# Patient Record
Sex: Female | Born: 1950 | Hispanic: No | Smoking: Current every day smoker
Health system: Southern US, Community
[De-identification: ages and names within clinical notes are randomized; demographics above are authoritative.]

## PROBLEM LIST (undated history)

## (undated) ENCOUNTER — Encounter
Attending: Student in an Organized Health Care Education/Training Program | Primary: Student in an Organized Health Care Education/Training Program

## (undated) ENCOUNTER — Encounter

## (undated) ENCOUNTER — Encounter: Attending: Mental Health | Primary: Mental Health

## (undated) ENCOUNTER — Encounter: Attending: Neurology | Primary: Neurology

## (undated) ENCOUNTER — Ambulatory Visit: Payer: PRIVATE HEALTH INSURANCE

## (undated) ENCOUNTER — Telehealth

## (undated) ENCOUNTER — Ambulatory Visit: Payer: PRIVATE HEALTH INSURANCE | Attending: Neurology | Primary: Neurology

## (undated) ENCOUNTER — Inpatient Hospital Stay

## (undated) ENCOUNTER — Telehealth: Attending: Mental Health | Primary: Mental Health

## (undated) ENCOUNTER — Ambulatory Visit

## (undated) ENCOUNTER — Encounter
Attending: Pharmacist Clinician (PhC)/ Clinical Pharmacy Specialist | Primary: Pharmacist Clinician (PhC)/ Clinical Pharmacy Specialist

## (undated) ENCOUNTER — Ambulatory Visit: Payer: PRIVATE HEALTH INSURANCE | Attending: Speech-Language Pathologist | Primary: Speech-Language Pathologist

## (undated) ENCOUNTER — Telehealth: Attending: Neurology | Primary: Neurology

## (undated) ENCOUNTER — Ambulatory Visit: Attending: Physician Assistant | Primary: Physician Assistant

## (undated) ENCOUNTER — Ambulatory Visit: Attending: Pharmacist | Primary: Pharmacist

## (undated) ENCOUNTER — Telehealth: Attending: Emergency Medicine | Primary: Emergency Medicine

## (undated) ENCOUNTER — Ambulatory Visit
Payer: PRIVATE HEALTH INSURANCE | Attending: Rehabilitative and Restorative Service Providers" | Primary: Rehabilitative and Restorative Service Providers"

## (undated) ENCOUNTER — Ambulatory Visit: Payer: PRIVATE HEALTH INSURANCE | Attending: Spinal Cord Injury Medicine | Primary: Spinal Cord Injury Medicine

## (undated) ENCOUNTER — Telehealth
Attending: Pharmacist Clinician (PhC)/ Clinical Pharmacy Specialist | Primary: Pharmacist Clinician (PhC)/ Clinical Pharmacy Specialist

## (undated) ENCOUNTER — Ambulatory Visit: Payer: PRIVATE HEALTH INSURANCE | Attending: Urology | Primary: Urology

## (undated) DIAGNOSIS — G35 Multiple sclerosis: Secondary | ICD-10-CM

## (undated) HISTORY — PX: TUBAL LIGATION: SHX77

---

## 2019-11-10 DIAGNOSIS — Z9181 History of falling: Secondary | ICD-10-CM | POA: Diagnosis not present

## 2019-11-10 DIAGNOSIS — Z13220 Encounter for screening for lipoid disorders: Secondary | ICD-10-CM | POA: Diagnosis not present

## 2019-11-10 DIAGNOSIS — F329 Major depressive disorder, single episode, unspecified: Secondary | ICD-10-CM | POA: Diagnosis not present

## 2019-11-10 DIAGNOSIS — R202 Paresthesia of skin: Secondary | ICD-10-CM | POA: Diagnosis not present

## 2019-11-10 DIAGNOSIS — Z131 Encounter for screening for diabetes mellitus: Secondary | ICD-10-CM | POA: Diagnosis not present

## 2019-11-10 DIAGNOSIS — Z1331 Encounter for screening for depression: Secondary | ICD-10-CM | POA: Diagnosis not present

## 2019-11-10 DIAGNOSIS — R29898 Other symptoms and signs involving the musculoskeletal system: Secondary | ICD-10-CM | POA: Diagnosis not present

## 2019-11-10 DIAGNOSIS — R05 Cough: Secondary | ICD-10-CM | POA: Diagnosis not present

## 2019-11-10 DIAGNOSIS — R5383 Other fatigue: Secondary | ICD-10-CM | POA: Diagnosis not present

## 2019-11-10 DIAGNOSIS — R269 Unspecified abnormalities of gait and mobility: Secondary | ICD-10-CM | POA: Diagnosis not present

## 2019-11-10 DIAGNOSIS — R200 Anesthesia of skin: Secondary | ICD-10-CM | POA: Diagnosis not present

## 2019-11-10 DIAGNOSIS — Z1212 Encounter for screening for malignant neoplasm of rectum: Secondary | ICD-10-CM | POA: Diagnosis not present

## 2019-11-10 DIAGNOSIS — R0600 Dyspnea, unspecified: Secondary | ICD-10-CM | POA: Diagnosis not present

## 2019-11-10 DIAGNOSIS — R0602 Shortness of breath: Secondary | ICD-10-CM | POA: Diagnosis not present

## 2019-11-10 DIAGNOSIS — R2689 Other abnormalities of gait and mobility: Secondary | ICD-10-CM | POA: Diagnosis not present

## 2019-11-10 DIAGNOSIS — G629 Polyneuropathy, unspecified: Secondary | ICD-10-CM | POA: Diagnosis not present

## 2019-11-10 DIAGNOSIS — Z1211 Encounter for screening for malignant neoplasm of colon: Secondary | ICD-10-CM | POA: Diagnosis not present

## 2019-11-16 DIAGNOSIS — M546 Pain in thoracic spine: Secondary | ICD-10-CM | POA: Diagnosis not present

## 2019-11-16 DIAGNOSIS — R29898 Other symptoms and signs involving the musculoskeletal system: Secondary | ICD-10-CM | POA: Diagnosis not present

## 2019-11-16 DIAGNOSIS — M545 Low back pain: Secondary | ICD-10-CM | POA: Diagnosis not present

## 2019-11-23 DIAGNOSIS — R2689 Other abnormalities of gait and mobility: Secondary | ICD-10-CM | POA: Diagnosis not present

## 2019-11-23 DIAGNOSIS — R269 Unspecified abnormalities of gait and mobility: Secondary | ICD-10-CM | POA: Diagnosis not present

## 2019-11-23 DIAGNOSIS — Z6824 Body mass index (BMI) 24.0-24.9, adult: Secondary | ICD-10-CM | POA: Diagnosis not present

## 2019-11-23 DIAGNOSIS — R202 Paresthesia of skin: Secondary | ICD-10-CM | POA: Diagnosis not present

## 2019-11-23 DIAGNOSIS — R42 Dizziness and giddiness: Secondary | ICD-10-CM | POA: Diagnosis not present

## 2019-11-23 DIAGNOSIS — F329 Major depressive disorder, single episode, unspecified: Secondary | ICD-10-CM | POA: Diagnosis not present

## 2019-11-23 DIAGNOSIS — R200 Anesthesia of skin: Secondary | ICD-10-CM | POA: Diagnosis not present

## 2019-11-23 DIAGNOSIS — S22089A Unspecified fracture of T11-T12 vertebra, initial encounter for closed fracture: Secondary | ICD-10-CM | POA: Diagnosis not present

## 2019-11-23 DIAGNOSIS — G379 Demyelinating disease of central nervous system, unspecified: Secondary | ICD-10-CM | POA: Diagnosis not present

## 2019-11-24 DIAGNOSIS — M542 Cervicalgia: Secondary | ICD-10-CM | POA: Diagnosis not present

## 2019-11-24 DIAGNOSIS — M546 Pain in thoracic spine: Secondary | ICD-10-CM | POA: Diagnosis not present

## 2019-11-24 DIAGNOSIS — M545 Low back pain: Secondary | ICD-10-CM | POA: Diagnosis not present

## 2019-11-27 DIAGNOSIS — G379 Demyelinating disease of central nervous system, unspecified: Principal | ICD-10-CM

## 2019-11-30 DIAGNOSIS — R200 Anesthesia of skin: Secondary | ICD-10-CM | POA: Diagnosis not present

## 2019-11-30 DIAGNOSIS — M545 Low back pain: Secondary | ICD-10-CM | POA: Diagnosis not present

## 2019-11-30 DIAGNOSIS — M542 Cervicalgia: Secondary | ICD-10-CM | POA: Diagnosis not present

## 2019-11-30 DIAGNOSIS — M47812 Spondylosis without myelopathy or radiculopathy, cervical region: Secondary | ICD-10-CM | POA: Diagnosis not present

## 2019-11-30 DIAGNOSIS — M546 Pain in thoracic spine: Secondary | ICD-10-CM | POA: Diagnosis not present

## 2019-11-30 DIAGNOSIS — M50222 Other cervical disc displacement at C5-C6 level: Secondary | ICD-10-CM | POA: Diagnosis not present

## 2019-11-30 DIAGNOSIS — M5127 Other intervertebral disc displacement, lumbosacral region: Secondary | ICD-10-CM | POA: Diagnosis not present

## 2019-12-08 DIAGNOSIS — M546 Pain in thoracic spine: Secondary | ICD-10-CM | POA: Diagnosis not present

## 2019-12-08 DIAGNOSIS — M545 Low back pain: Secondary | ICD-10-CM | POA: Diagnosis not present

## 2019-12-08 DIAGNOSIS — M542 Cervicalgia: Secondary | ICD-10-CM | POA: Diagnosis not present

## 2019-12-11 ENCOUNTER — Ambulatory Visit: Admit: 2019-12-11 | Discharge: 2019-12-12 | Payer: PRIVATE HEALTH INSURANCE

## 2019-12-13 ENCOUNTER — Ambulatory Visit: Admit: 2019-12-13 | Discharge: 2019-12-14 | Payer: PRIVATE HEALTH INSURANCE | Attending: Neurology | Primary: Neurology

## 2019-12-13 DIAGNOSIS — Z7952 Long term (current) use of systemic steroids: Secondary | ICD-10-CM | POA: Diagnosis not present

## 2019-12-13 DIAGNOSIS — N3941 Urge incontinence: Secondary | ICD-10-CM | POA: Diagnosis not present

## 2019-12-13 DIAGNOSIS — G379 Demyelinating disease of central nervous system, unspecified: Secondary | ICD-10-CM | POA: Diagnosis not present

## 2019-12-13 DIAGNOSIS — M79604 Pain in right leg: Secondary | ICD-10-CM | POA: Diagnosis not present

## 2019-12-13 DIAGNOSIS — H539 Unspecified visual disturbance: Secondary | ICD-10-CM | POA: Diagnosis not present

## 2019-12-13 DIAGNOSIS — G959 Disease of spinal cord, unspecified: Secondary | ICD-10-CM | POA: Diagnosis not present

## 2019-12-13 DIAGNOSIS — Z114 Encounter for screening for human immunodeficiency virus [HIV]: Secondary | ICD-10-CM | POA: Diagnosis not present

## 2019-12-13 DIAGNOSIS — M79605 Pain in left leg: Secondary | ICD-10-CM | POA: Diagnosis not present

## 2019-12-13 DIAGNOSIS — Z1159 Encounter for screening for other viral diseases: Secondary | ICD-10-CM | POA: Diagnosis not present

## 2019-12-13 DIAGNOSIS — F17210 Nicotine dependence, cigarettes, uncomplicated: Secondary | ICD-10-CM | POA: Diagnosis not present

## 2019-12-13 DIAGNOSIS — R519 Headache, unspecified: Secondary | ICD-10-CM | POA: Diagnosis not present

## 2019-12-13 MED ORDER — FAMOTIDINE 20 MG TABLET
ORAL_TABLET | Freq: Two times a day (BID) | ORAL | PRN refills | 30.00000 days | Status: CP
Start: 2019-12-13 — End: ?

## 2019-12-13 MED ORDER — CALCIUM CARBONATE 600 MG CALCIUM (1,500 MG) TABLET
ORAL_TABLET | Freq: Every day | ORAL | PRN refills | 30.00000 days | Status: CP
Start: 2019-12-13 — End: ?

## 2019-12-20 DIAGNOSIS — I700 Atherosclerosis of aorta: Secondary | ICD-10-CM | POA: Diagnosis not present

## 2019-12-20 DIAGNOSIS — G379 Demyelinating disease of central nervous system, unspecified: Secondary | ICD-10-CM | POA: Diagnosis not present

## 2019-12-20 DIAGNOSIS — F329 Major depressive disorder, single episode, unspecified: Secondary | ICD-10-CM | POA: Diagnosis not present

## 2019-12-20 DIAGNOSIS — K5900 Constipation, unspecified: Secondary | ICD-10-CM | POA: Diagnosis not present

## 2019-12-20 DIAGNOSIS — S22089A Unspecified fracture of T11-T12 vertebra, initial encounter for closed fracture: Secondary | ICD-10-CM | POA: Diagnosis not present

## 2020-01-10 ENCOUNTER — Institutional Professional Consult (permissible substitution): Admit: 2020-01-10 | Payer: PRIVATE HEALTH INSURANCE | Attending: Neurology | Primary: Neurology

## 2020-01-10 DIAGNOSIS — G35 Multiple sclerosis: Secondary | ICD-10-CM | POA: Diagnosis not present

## 2020-01-10 DIAGNOSIS — E559 Vitamin D deficiency, unspecified: Secondary | ICD-10-CM | POA: Diagnosis not present

## 2020-01-10 MED ORDER — PREDNISONE 5 MG TABLET
ORAL_TABLET | 0 refills | 0 days | Status: CP
Start: 2020-01-10 — End: ?

## 2020-01-10 MED ORDER — ERGOCALCIFEROL (VITAMIN D2) 1,250 MCG (50,000 UNIT) CAPSULE
ORAL_CAPSULE | ORAL | 0 refills | 168 days | Status: CP
Start: 2020-01-10 — End: ?

## 2020-01-29 DIAGNOSIS — G35 Multiple sclerosis: Principal | ICD-10-CM

## 2020-01-29 MED ORDER — DIMETHYL FUMARATE 120 MG (14)-240 MG (46) CAPSULE,DELAYED RELEASE
ORAL_CAPSULE | 0 refills | 0 days | Status: CP
Start: 2020-01-29 — End: ?

## 2020-01-29 MED ORDER — DIMETHYL FUMARATE 240 MG CAPSULE,DELAYED RELEASE
ORAL_CAPSULE | Freq: Two times a day (BID) | ORAL | 1 refills | 30.00000 days | Status: CP
Start: 2020-01-29 — End: ?

## 2020-01-31 DIAGNOSIS — G35 Multiple sclerosis: Principal | ICD-10-CM

## 2020-02-05 DIAGNOSIS — G35 Multiple sclerosis: Secondary | ICD-10-CM | POA: Diagnosis not present

## 2020-02-05 DIAGNOSIS — S22089A Unspecified fracture of T11-T12 vertebra, initial encounter for closed fracture: Secondary | ICD-10-CM | POA: Diagnosis not present

## 2020-02-05 DIAGNOSIS — G4700 Insomnia, unspecified: Secondary | ICD-10-CM | POA: Diagnosis not present

## 2020-02-05 DIAGNOSIS — I700 Atherosclerosis of aorta: Secondary | ICD-10-CM | POA: Diagnosis not present

## 2020-02-05 DIAGNOSIS — K5900 Constipation, unspecified: Secondary | ICD-10-CM | POA: Diagnosis not present

## 2020-02-05 DIAGNOSIS — F329 Major depressive disorder, single episode, unspecified: Secondary | ICD-10-CM | POA: Diagnosis not present

## 2020-02-05 MED ORDER — TRAZODONE 50 MG TABLET
0 days
Start: 2020-02-05 — End: ?

## 2020-02-07 NOTE — Unmapped (Signed)
[facility] SSC Specialty Medication Onboarding    Specialty Medication: Dimethyl Fumarate starter pack  Prior Authorization: Approved   Financial Assistance: No - patient doesn't qualify for additional assistance   Final Copay/Day Supply: $90 / 30 days    Insurance Restrictions: Yes - max 1 month supply     Notes to Pharmacist:     The triage team has completed the benefits investigation and has determined that the patient is able to fill this medication at [facility]. Please contact the patient to complete the onboarding or follow up with the prescribing physician as needed.      [facility] SSC Specialty Medication Onboarding    Specialty Medication: Dimethyl Fumarate 240mg  capsules  Prior Authorization: Approved   Financial Assistance: No - patient doesn't qualify for additional assistance   Final Copay/Day Supply: $191.35 / 30 days    Insurance Restrictions: Yes - max 1 month supply     Notes to Pharmacist:     The triage team has completed the benefits investigation and has determined that the patient is able to fill this medication at [facility]. Please contact the patient to complete the onboarding or follow up with the prescribing physician as needed.

## 2020-02-08 NOTE — Unmapped (Unsigned)
This patient has been disenrolled from the [facility] specialty pharmacy services due to enrollment in a manufacturer assistance program that sends medicine directly to the patient.    Leslie Goodman  [facility] Specialty Pharmacist appetite, fatigue)      Contraindications, Warnings & Precautions     ??? Hepatotoxicity ??? LFTs prior to treatment and during treatment as clinically necessary  ??? 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 Leukopenia ??? CBC including lymphocyte count prior to initiation and every 3 months thereafter as clinically necessary    Drug/Food Interactions     ??? Medication list reviewed in Epic. The patient was instructed to inform the care team before taking any new medications or supplements. No drug interactions identified.   ??? Consider avoiding live vaccines during treatment (not explicitly contraindicated)     Storage, Handling Precautions, & Disposal     ??? Store at room temperature in the original, labelled container.      Current Medications (including OTC/herbals), Comorbidities and Allergies     Current Outpatient Medications   Medication Sig Dispense Refill   ??? calcium carbonate (CALCIUM 600) 1,500 mg (600 mg elem calcium) tablet Take 1 tablet (600 mg of elem calcium total) by mouth daily. 30 tablet PRN   ??? cyanocobalamin, vitamin B-12, (VITAMIN B-12) 1000 MCG tablet Take 1,000 mcg by mouth daily.     ??? dimethyl fumarate 120 mg (14)- 240 mg (46) CpDR Take 1 (120 mg) capsule by mouth twice daily for 7 days. THEN take 1 (240 mg) twice daily thereafter. Take with food 60 capsule 0   ??? dimethyl fumarate 240 mg CpDR Take 1 capsule (240 mg total) by mouth two (2) times a day. 60 capsule 1   ??? DULoxetine (CYMBALTA) 60 MG capsule      ??? ergocalciferol-1,250 mcg, 50,000 unit, (DRISDOL) 1,250 mcg (50,000 unit) capsule Take 1 capsule (1,250 mcg total) by mouth once a week. 24 capsule 0   ??? famotidine (PEPCID) 20 MG tablet Take 1 tablet (20 mg total) by mouth Two (2) times a day. As Goodman as on steroids 60 tablet PRN   ??? polyethylene glycol (GLYCOLAX) 17 gram/dose powder      ??? predniSONE (DELTASONE) 5 MG tablet Take Prednisone 10 mg/day for 2 weeks, then 5 mg/day for 2 weeks, then stop 21 tablet 0     No current facility-administered medications for this visit.       No Known Allergies    There is no problem list on file for this patient.      Reviewed and up to date in Epic.    Appropriateness of Therapy     Is medication and dose appropriate based on diagnosis? Yes    Prescription has been clinically reviewed: Yes    Baseline Quality of Life Assessment      How many days over the past month did your MS  keep you from your normal activities? For example, brushing your teeth or getting up in the morning. {Blank:19197::0,***,Patient declined to answer}    Financial Information     Medication Assistance provided: Prior Authorization    Anticipated copay of $90 (for starter) & $191.35 (for maintenance) reviewed with patient. Verified delivery address.    Delivery Information     Scheduled delivery date: ***    Expected start date: ***    Medication will be delivered via {Blank:19197::UPS,Next Day Courier,Same Day Courier,Clinic Courier - ***  clinic,***} to the {Blank:19197::prescription,temporary} address in Epic WAM.  This shipment {Blank single:19197::will,will not} require a signature.      Explained the services we provide at [facility] and that each month we would call to set up refills.  Stressed importance of returning phone calls so that we could ensure they receive their medications in time each month.  Informed patient that we should be setting up refills 7-10 days prior to when they will run out of medication.  A pharmacist will reach out to perform a clinical assessment periodically.  Informed patient that a welcome packet and a drug information handout will be sent.      Patient verbalized understanding of the above information as well as how to contact the pharmacy at [phone] option [phone] with any questions/concerns.  The pharmacy is open Monday through Friday 8:30am-4:30pm.  A pharmacist is available 24/7 via pager to answer any clinical questions they may have.    Patient Specific Needs     - Does the patient have any physical, cognitive, or cultural barriers? {Blank single:19197::No,Yes - ***}    - Patient prefers to have medications discussed with  {Blank single:19197::Patient,Family Member,Caregiver,Other}     - Is the patient or caregiver able to read and understand education materials at a high school level or above? {Blank single:19197::No,Yes}    - Patient's primary language is  {Blank single:19197::English,Spanish,***}     - Is the patient high risk? {sschighriskpts:78327}    - Does the patient require a Care Management Plan? {Blank single:19197::No,Yes}     - Does the patient require physician intervention or other additional services (i.e. nutrition, smoking cessation, social work)? {Blank single:19197::No,Yes - ***}      Leslie Goodman  [facility] Specialty Pharmacist

## 2020-02-09 DIAGNOSIS — G35 Multiple sclerosis: Principal | ICD-10-CM

## 2020-02-09 MED ORDER — VUMERITY 231 MG CAPSULE,DELAYED RELEASE
ORAL_CAPSULE | 2 refills | 0 days | Status: CP
Start: 2020-02-09 — End: ?

## 2020-02-09 NOTE — Unmapped (Signed)
Neurology Clinical Pharmacist Telephone Call    Medication: Vumerity    Called patient to discuss alternative DMT due to cost of dimethyl fumarate. Discussed Vumerity, dosing, side effects, administration (taking without food, or low calorie/low protein snack), efficacy compared to dimethyl fumarate and copay assistance. Patient would like to try and see cost for Vumerity. Will send rx to pharmacy and start form via mychart and regular mail per patient request.    Worthy Flank, PharmD, CPP  Clinical Pharmacist, [facility]  Phone: [phone]

## 2020-02-12 DIAGNOSIS — G35 Multiple sclerosis: Principal | ICD-10-CM

## 2020-02-12 NOTE — Unmapped (Signed)
Thanks

## 2020-04-26 DIAGNOSIS — R2689 Other abnormalities of gait and mobility: Secondary | ICD-10-CM | POA: Diagnosis not present

## 2020-04-26 DIAGNOSIS — I700 Atherosclerosis of aorta: Secondary | ICD-10-CM | POA: Diagnosis not present

## 2020-04-26 DIAGNOSIS — G35 Multiple sclerosis: Secondary | ICD-10-CM | POA: Diagnosis not present

## 2020-04-26 DIAGNOSIS — F339 Major depressive disorder, recurrent, unspecified: Secondary | ICD-10-CM | POA: Diagnosis not present

## 2020-04-26 DIAGNOSIS — R531 Weakness: Secondary | ICD-10-CM | POA: Diagnosis not present

## 2020-04-26 DIAGNOSIS — D519 Vitamin B12 deficiency anemia, unspecified: Secondary | ICD-10-CM | POA: Diagnosis not present

## 2020-04-26 DIAGNOSIS — E785 Hyperlipidemia, unspecified: Secondary | ICD-10-CM | POA: Diagnosis not present

## 2020-04-26 DIAGNOSIS — Z23 Encounter for immunization: Secondary | ICD-10-CM | POA: Diagnosis not present

## 2020-04-26 DIAGNOSIS — S22089A Unspecified fracture of T11-T12 vertebra, initial encounter for closed fracture: Secondary | ICD-10-CM | POA: Diagnosis not present

## 2020-05-01 DIAGNOSIS — G4701 Insomnia due to medical condition: Secondary | ICD-10-CM | POA: Diagnosis not present

## 2020-05-01 DIAGNOSIS — I700 Atherosclerosis of aorta: Secondary | ICD-10-CM | POA: Diagnosis not present

## 2020-05-01 DIAGNOSIS — G35 Multiple sclerosis: Secondary | ICD-10-CM | POA: Diagnosis not present

## 2020-05-01 DIAGNOSIS — D519 Vitamin B12 deficiency anemia, unspecified: Secondary | ICD-10-CM | POA: Diagnosis not present

## 2020-05-01 DIAGNOSIS — S22089D Unspecified fracture of T11-T12 vertebra, subsequent encounter for fracture with routine healing: Secondary | ICD-10-CM | POA: Diagnosis not present

## 2020-05-01 DIAGNOSIS — F17200 Nicotine dependence, unspecified, uncomplicated: Secondary | ICD-10-CM | POA: Diagnosis not present

## 2020-05-01 DIAGNOSIS — M2550 Pain in unspecified joint: Secondary | ICD-10-CM | POA: Diagnosis not present

## 2020-05-01 DIAGNOSIS — Z9181 History of falling: Secondary | ICD-10-CM | POA: Diagnosis not present

## 2020-05-01 DIAGNOSIS — E785 Hyperlipidemia, unspecified: Secondary | ICD-10-CM | POA: Diagnosis not present

## 2020-05-01 DIAGNOSIS — Z79899 Other long term (current) drug therapy: Secondary | ICD-10-CM | POA: Diagnosis not present

## 2020-05-01 DIAGNOSIS — F339 Major depressive disorder, recurrent, unspecified: Secondary | ICD-10-CM | POA: Diagnosis not present

## 2020-05-02 DIAGNOSIS — Z1331 Encounter for screening for depression: Secondary | ICD-10-CM | POA: Diagnosis not present

## 2020-05-02 DIAGNOSIS — Z0000 Encounter for general adult medical examination without abnormal findings: Secondary | ICD-10-CM | POA: Diagnosis not present

## 2020-05-02 DIAGNOSIS — Z9181 History of falling: Secondary | ICD-10-CM | POA: Diagnosis not present

## 2020-05-02 DIAGNOSIS — E785 Hyperlipidemia, unspecified: Secondary | ICD-10-CM | POA: Diagnosis not present

## 2020-05-02 DIAGNOSIS — Z139 Encounter for screening, unspecified: Secondary | ICD-10-CM | POA: Diagnosis not present

## 2020-05-02 MED ORDER — ROSUVASTATIN 5 MG TABLET
0 days
Start: 2020-05-02 — End: ?

## 2020-05-15 ENCOUNTER — Ambulatory Visit
Admit: 2020-05-15 | Discharge: 2020-05-16 | Payer: PRIVATE HEALTH INSURANCE | Attending: Spinal Cord Injury Medicine | Primary: Spinal Cord Injury Medicine

## 2020-05-15 ENCOUNTER — Ambulatory Visit: Admit: 2020-05-15 | Discharge: 2020-05-16 | Payer: PRIVATE HEALTH INSURANCE | Attending: Neurology | Primary: Neurology

## 2020-05-15 DIAGNOSIS — N319 Neuromuscular dysfunction of bladder, unspecified: Secondary | ICD-10-CM | POA: Diagnosis not present

## 2020-05-15 DIAGNOSIS — F419 Anxiety disorder, unspecified: Secondary | ICD-10-CM | POA: Diagnosis not present

## 2020-05-15 DIAGNOSIS — G35 Multiple sclerosis: Secondary | ICD-10-CM | POA: Diagnosis not present

## 2020-05-15 MED ORDER — AMANTADINE HCL 100 MG TABLET
ORAL_TABLET | Freq: Every morning | ORAL | 1 refills | 90 days | Status: CP
Start: 2020-05-15 — End: ?
  Filled 2020-05-21: qty 90, 90d supply, fill #0

## 2020-05-20 DIAGNOSIS — Z9181 History of falling: Secondary | ICD-10-CM | POA: Diagnosis not present

## 2020-05-20 DIAGNOSIS — S22089D Unspecified fracture of T11-T12 vertebra, subsequent encounter for fracture with routine healing: Secondary | ICD-10-CM | POA: Diagnosis not present

## 2020-05-20 DIAGNOSIS — G35 Multiple sclerosis: Secondary | ICD-10-CM | POA: Diagnosis not present

## 2020-05-20 DIAGNOSIS — R54 Age-related physical debility: Secondary | ICD-10-CM | POA: Diagnosis not present

## 2020-05-20 DIAGNOSIS — G629 Polyneuropathy, unspecified: Secondary | ICD-10-CM | POA: Diagnosis not present

## 2020-05-20 DIAGNOSIS — G379 Demyelinating disease of central nervous system, unspecified: Secondary | ICD-10-CM | POA: Diagnosis not present

## 2020-05-21 MED FILL — AMANTADINE HCL 100 MG TABLET: 90 days supply | Qty: 90 | Fill #0 | Status: AC

## 2020-05-25 MED ORDER — VUMERITY 231 MG CAPSULE,DELAYED RELEASE
ORAL_CAPSULE | Freq: Two times a day (BID) | ORAL | 5 refills | 0 days | Status: CP
Start: 2020-05-25 — End: ?

## 2020-05-25 MED ORDER — DIAZEPAM 5 MG TABLET
ORAL_TABLET | Freq: Four times a day (QID) | ORAL | 0 refills | 1 days | Status: CP | PRN
Start: 2020-05-25 — End: ?
  Filled 2020-06-03: qty 1, 1d supply, fill #0

## 2020-05-31 DIAGNOSIS — I700 Atherosclerosis of aorta: Secondary | ICD-10-CM | POA: Diagnosis not present

## 2020-05-31 DIAGNOSIS — S22089D Unspecified fracture of T11-T12 vertebra, subsequent encounter for fracture with routine healing: Secondary | ICD-10-CM | POA: Diagnosis not present

## 2020-05-31 DIAGNOSIS — G35 Multiple sclerosis: Secondary | ICD-10-CM | POA: Diagnosis not present

## 2020-05-31 DIAGNOSIS — D519 Vitamin B12 deficiency anemia, unspecified: Secondary | ICD-10-CM | POA: Diagnosis not present

## 2020-05-31 DIAGNOSIS — G4701 Insomnia due to medical condition: Secondary | ICD-10-CM | POA: Diagnosis not present

## 2020-05-31 DIAGNOSIS — F339 Major depressive disorder, recurrent, unspecified: Secondary | ICD-10-CM | POA: Diagnosis not present

## 2020-05-31 DIAGNOSIS — E785 Hyperlipidemia, unspecified: Secondary | ICD-10-CM | POA: Diagnosis not present

## 2020-05-31 DIAGNOSIS — F17200 Nicotine dependence, unspecified, uncomplicated: Secondary | ICD-10-CM | POA: Diagnosis not present

## 2020-05-31 DIAGNOSIS — M2550 Pain in unspecified joint: Secondary | ICD-10-CM | POA: Diagnosis not present

## 2020-05-31 DIAGNOSIS — Z9181 History of falling: Secondary | ICD-10-CM | POA: Diagnosis not present

## 2020-05-31 DIAGNOSIS — Z79899 Other long term (current) drug therapy: Secondary | ICD-10-CM | POA: Diagnosis not present

## 2020-06-03 MED FILL — DIAZEPAM 5 MG TABLET: 1 days supply | Qty: 1 | Fill #0 | Status: AC

## 2020-08-28 MED FILL — AMANTADINE HCL 100 MG TABLET: ORAL | 90 days supply | Qty: 90 | Fill #1

## 2020-08-31 ENCOUNTER — Ambulatory Visit: Admit: 2020-08-31 | Discharge: 2020-08-31 | Payer: PRIVATE HEALTH INSURANCE

## 2020-08-31 DIAGNOSIS — R9082 White matter disease, unspecified: Secondary | ICD-10-CM | POA: Diagnosis not present

## 2020-08-31 DIAGNOSIS — G319 Degenerative disease of nervous system, unspecified: Secondary | ICD-10-CM | POA: Diagnosis not present

## 2020-08-31 DIAGNOSIS — G35 Multiple sclerosis: Secondary | ICD-10-CM | POA: Diagnosis not present

## 2020-08-31 DIAGNOSIS — G959 Disease of spinal cord, unspecified: Secondary | ICD-10-CM | POA: Diagnosis not present

## 2020-09-02 MED ORDER — GABAPENTIN 100 MG CAPSULE
0 days
Start: 2020-09-02 — End: ?

## 2020-09-04 ENCOUNTER — Institutional Professional Consult (permissible substitution): Admit: 2020-09-04 | Payer: PRIVATE HEALTH INSURANCE | Attending: Neurology | Primary: Neurology

## 2020-09-04 DIAGNOSIS — N319 Neuromuscular dysfunction of bladder, unspecified: Secondary | ICD-10-CM | POA: Diagnosis not present

## 2020-09-04 DIAGNOSIS — G35 Multiple sclerosis: Secondary | ICD-10-CM | POA: Diagnosis not present

## 2020-09-04 MED ORDER — CHOLECALCIFEROL (VITAMIN D3) 100 MCG (4,000 UNIT) CAPSULE
ORAL_CAPSULE | Freq: Every day | ORAL | 11 refills | 0 days
Start: 2020-09-04 — End: ?

## 2020-09-22 MED ORDER — AMANTADINE HCL 100 MG TABLET
ORAL_TABLET | Freq: Every morning | ORAL | 5 refills | 90 days | Status: CP
Start: 2020-09-22 — End: ?

## 2020-09-22 MED ORDER — VUMERITY 231 MG CAPSULE,DELAYED RELEASE
ORAL_CAPSULE | Freq: Two times a day (BID) | ORAL | 1 refills | 0 days | Status: CP
Start: 2020-09-22 — End: ?

## 2020-11-06 ENCOUNTER — Ambulatory Visit: Admit: 2020-11-06 | Discharge: 2020-11-07 | Payer: PRIVATE HEALTH INSURANCE | Attending: Neurology | Primary: Neurology

## 2020-11-06 DIAGNOSIS — N319 Neuromuscular dysfunction of bladder, unspecified: Secondary | ICD-10-CM | POA: Diagnosis not present

## 2020-11-06 DIAGNOSIS — G35 Multiple sclerosis: Secondary | ICD-10-CM | POA: Diagnosis not present

## 2020-11-06 DIAGNOSIS — R5383 Other fatigue: Secondary | ICD-10-CM | POA: Diagnosis not present

## 2020-11-06 DIAGNOSIS — Z5181 Encounter for therapeutic drug level monitoring: Secondary | ICD-10-CM | POA: Diagnosis not present

## 2020-11-06 MED ORDER — AMANTADINE HCL 100 MG TABLET
ORAL_TABLET | Freq: Every morning | ORAL | 5 refills | 60 days | Status: CP
Start: 2020-11-06 — End: ?

## 2020-11-07 MED ORDER — CHOLECALCIFEROL (VITAMIN D3) 100 MCG (4,000 UNIT) CAPSULE
ORAL_CAPSULE | Freq: Every day | ORAL | 11 refills | 0 days
Start: 2020-11-07 — End: ?

## 2020-11-08 DIAGNOSIS — R5383 Other fatigue: Principal | ICD-10-CM

## 2020-11-08 DIAGNOSIS — G35 Multiple sclerosis: Principal | ICD-10-CM

## 2020-11-08 MED ORDER — AMANTADINE HCL 100 MG TABLET
ORAL_TABLET | Freq: Every morning | ORAL | 5 refills | 30 days | Status: CP
Start: 2020-11-08 — End: ?
  Filled 2020-11-11: qty 60, 30d supply, fill #0

## 2020-11-12 MED ORDER — CHOLECALCIFEROL (VITAMIN D3) 100 MCG (4,000 UNIT) CAPSULE
ORAL_CAPSULE | Freq: Every day | ORAL | 11 refills | 0.00000 days
Start: 2020-11-12 — End: ?

## 2020-11-17 MED ORDER — VUMERITY 231 MG CAPSULE,DELAYED RELEASE
ORAL_CAPSULE | Freq: Two times a day (BID) | ORAL | 5 refills | 0.00000 days | Status: CP
Start: 2020-11-17 — End: 2020-11-17

## 2020-12-26 MED FILL — AMANTADINE HCL 100 MG TABLET: ORAL | 30 days supply | Qty: 60 | Fill #1

## 2021-01-24 MED FILL — AMANTADINE HCL 100 MG TABLET: ORAL | 30 days supply | Qty: 60 | Fill #2

## 2021-02-09 ENCOUNTER — Other Ambulatory Visit: Payer: Self-pay

## 2021-02-09 ENCOUNTER — Emergency Department (HOSPITAL_COMMUNITY): Payer: PPO

## 2021-02-09 ENCOUNTER — Inpatient Hospital Stay (HOSPITAL_COMMUNITY)
Admission: EM | Admit: 2021-02-09 | Discharge: 2021-02-17 | DRG: 481 | Disposition: A | Discharge status: Skilled Nursing Facility | Payer: PPO | Attending: Family Medicine | Admitting: Family Medicine

## 2021-02-09 ENCOUNTER — Encounter (HOSPITAL_COMMUNITY): Payer: Self-pay | Admitting: Emergency Medicine

## 2021-02-09 DIAGNOSIS — M549 Dorsalgia, unspecified: Secondary | ICD-10-CM | POA: Diagnosis present

## 2021-02-09 DIAGNOSIS — Z8781 Personal history of (healed) traumatic fracture: Secondary | ICD-10-CM

## 2021-02-09 DIAGNOSIS — M545 Low back pain, unspecified: Secondary | ICD-10-CM | POA: Diagnosis not present

## 2021-02-09 DIAGNOSIS — S72001A Fracture of unspecified part of neck of right femur, initial encounter for closed fracture: Secondary | ICD-10-CM | POA: Diagnosis present

## 2021-02-09 DIAGNOSIS — D72829 Elevated white blood cell count, unspecified: Secondary | ICD-10-CM | POA: Diagnosis present

## 2021-02-09 DIAGNOSIS — K5900 Constipation, unspecified: Secondary | ICD-10-CM | POA: Diagnosis not present

## 2021-02-09 DIAGNOSIS — S72001D Fracture of unspecified part of neck of right femur, subsequent encounter for closed fracture with routine healing: Secondary | ICD-10-CM | POA: Diagnosis not present

## 2021-02-09 DIAGNOSIS — W010XXA Fall on same level from slipping, tripping and stumbling without subsequent striking against object, initial encounter: Secondary | ICD-10-CM | POA: Diagnosis present

## 2021-02-09 DIAGNOSIS — M25551 Pain in right hip: Secondary | ICD-10-CM | POA: Diagnosis not present

## 2021-02-09 DIAGNOSIS — R0902 Hypoxemia: Secondary | ICD-10-CM | POA: Diagnosis not present

## 2021-02-09 DIAGNOSIS — S32000A Wedge compression fracture of unspecified lumbar vertebra, initial encounter for closed fracture: Secondary | ICD-10-CM | POA: Diagnosis not present

## 2021-02-09 DIAGNOSIS — E785 Hyperlipidemia, unspecified: Secondary | ICD-10-CM | POA: Diagnosis present

## 2021-02-09 DIAGNOSIS — Z043 Encounter for examination and observation following other accident: Secondary | ICD-10-CM | POA: Diagnosis not present

## 2021-02-09 DIAGNOSIS — M79604 Pain in right leg: Secondary | ICD-10-CM | POA: Diagnosis not present

## 2021-02-09 DIAGNOSIS — Z981 Arthrodesis status: Secondary | ICD-10-CM | POA: Diagnosis not present

## 2021-02-09 DIAGNOSIS — S72011A Unspecified intracapsular fracture of right femur, initial encounter for closed fracture: Principal | ICD-10-CM | POA: Diagnosis present

## 2021-02-09 DIAGNOSIS — F17210 Nicotine dependence, cigarettes, uncomplicated: Secondary | ICD-10-CM | POA: Diagnosis present

## 2021-02-09 DIAGNOSIS — W1809XA Striking against other object with subsequent fall, initial encounter: Secondary | ICD-10-CM | POA: Diagnosis not present

## 2021-02-09 DIAGNOSIS — Z8249 Family history of ischemic heart disease and other diseases of the circulatory system: Secondary | ICD-10-CM | POA: Diagnosis not present

## 2021-02-09 DIAGNOSIS — S32019A Unspecified fracture of first lumbar vertebra, initial encounter for closed fracture: Secondary | ICD-10-CM | POA: Diagnosis present

## 2021-02-09 DIAGNOSIS — Z419 Encounter for procedure for purposes other than remedying health state, unspecified: Secondary | ICD-10-CM

## 2021-02-09 DIAGNOSIS — Z20822 Contact with and (suspected) exposure to covid-19: Secondary | ICD-10-CM | POA: Diagnosis present

## 2021-02-09 DIAGNOSIS — G35 Multiple sclerosis: Secondary | ICD-10-CM | POA: Diagnosis present

## 2021-02-09 DIAGNOSIS — M47812 Spondylosis without myelopathy or radiculopathy, cervical region: Secondary | ICD-10-CM | POA: Diagnosis not present

## 2021-02-09 DIAGNOSIS — Z833 Family history of diabetes mellitus: Secondary | ICD-10-CM | POA: Diagnosis not present

## 2021-02-09 DIAGNOSIS — Z9889 Other specified postprocedural states: Secondary | ICD-10-CM | POA: Diagnosis not present

## 2021-02-09 DIAGNOSIS — W19XXXA Unspecified fall, initial encounter: Secondary | ICD-10-CM | POA: Diagnosis not present

## 2021-02-09 DIAGNOSIS — S32010A Wedge compression fracture of first lumbar vertebra, initial encounter for closed fracture: Secondary | ICD-10-CM | POA: Diagnosis not present

## 2021-02-09 DIAGNOSIS — T148XXA Other injury of unspecified body region, initial encounter: Secondary | ICD-10-CM

## 2021-02-09 DIAGNOSIS — Z01810 Encounter for preprocedural cardiovascular examination: Secondary | ICD-10-CM | POA: Diagnosis not present

## 2021-02-09 DIAGNOSIS — R531 Weakness: Secondary | ICD-10-CM | POA: Diagnosis not present

## 2021-02-09 DIAGNOSIS — Z7401 Bed confinement status: Secondary | ICD-10-CM | POA: Diagnosis not present

## 2021-02-09 DIAGNOSIS — S72009A Fracture of unspecified part of neck of unspecified femur, initial encounter for closed fracture: Secondary | ICD-10-CM | POA: Diagnosis present

## 2021-02-09 DIAGNOSIS — M4856XA Collapsed vertebra, not elsewhere classified, lumbar region, initial encounter for fracture: Secondary | ICD-10-CM | POA: Diagnosis present

## 2021-02-09 DIAGNOSIS — S72051A Unspecified fracture of head of right femur, initial encounter for closed fracture: Secondary | ICD-10-CM | POA: Diagnosis not present

## 2021-02-09 DIAGNOSIS — I6782 Cerebral ischemia: Secondary | ICD-10-CM | POA: Diagnosis not present

## 2021-02-09 DIAGNOSIS — M419 Scoliosis, unspecified: Secondary | ICD-10-CM | POA: Diagnosis not present

## 2021-02-09 DIAGNOSIS — R9431 Abnormal electrocardiogram [ECG] [EKG]: Secondary | ICD-10-CM | POA: Diagnosis not present

## 2021-02-09 DIAGNOSIS — T07XXXA Unspecified multiple injuries, initial encounter: Secondary | ICD-10-CM | POA: Diagnosis not present

## 2021-02-09 HISTORY — DX: Multiple sclerosis: G35

## 2021-02-09 LAB — CBC WITH DIFFERENTIAL/PLATELET
Abs Immature Granulocytes: 0.1 10*3/uL — ABNORMAL HIGH (ref 0.00–0.07)
Basophils Absolute: 0.1 10*3/uL (ref 0.0–0.1)
Basophils Relative: 0 %
Eosinophils Absolute: 0 10*3/uL (ref 0.0–0.5)
Eosinophils Relative: 0 %
HCT: 41.9 % (ref 36.0–46.0)
Hemoglobin: 14.2 g/dL (ref 12.0–15.0)
Immature Granulocytes: 1 %
Lymphocytes Relative: 7 %
Lymphs Abs: 1.1 10*3/uL (ref 0.7–4.0)
MCH: 34 pg (ref 26.0–34.0)
MCHC: 33.9 g/dL (ref 30.0–36.0)
MCV: 100.2 fL — ABNORMAL HIGH (ref 80.0–100.0)
Monocytes Absolute: 1.3 10*3/uL — ABNORMAL HIGH (ref 0.1–1.0)
Monocytes Relative: 8 %
Neutro Abs: 13.3 10*3/uL — ABNORMAL HIGH (ref 1.7–7.7)
Neutrophils Relative %: 84 %
Platelets: 199 10*3/uL (ref 150–400)
RBC: 4.18 MIL/uL (ref 3.87–5.11)
RDW: 13.3 % (ref 11.5–15.5)
WBC: 15.9 10*3/uL — ABNORMAL HIGH (ref 4.0–10.5)
nRBC: 0 % (ref 0.0–0.2)

## 2021-02-09 LAB — BASIC METABOLIC PANEL
Anion gap: 13 (ref 5–15)
BUN: 11 mg/dL (ref 8–23)
CO2: 22 mmol/L (ref 22–32)
Calcium: 9.1 mg/dL (ref 8.9–10.3)
Chloride: 103 mmol/L (ref 98–111)
Creatinine, Ser: 0.56 mg/dL (ref 0.44–1.00)
GFR, Estimated: >60 mL/min (ref >60)
Glucose, Bld: 110 mg/dL — ABNORMAL HIGH (ref 70–99)
Potassium: 3.6 mmol/L (ref 3.5–5.1)
Sodium: 138 mmol/L (ref 135–145)

## 2021-02-09 MED ORDER — FENTANYL CITRATE PF 50 MCG/ML IJ SOSY
50.0000 ug | PREFILLED_SYRINGE | Freq: Once | INTRAMUSCULAR | Status: AC
  Administered 2021-02-10: 50 ug via INTRAVENOUS
  Filled 2021-02-09: qty 1

## 2021-02-09 NOTE — ED Provider Notes (Signed)
Emergency Medicine Provider Triage Evaluation Note  Carrie Martinez , a 70 y.o. female  was evaluated in triage.  Pt complains of mechanical fall trying to get to smoke detector. Took Norco at [facility]. No thinners. Pain to back and right hip, femur  Hx of MS. Typically has issues with right leg however more pain currently  Review of Systems  Positive: Fall, back pain, hip, femur pain Negative: Syncope, CP, SOB, numbness  Physical Exam  There were no vitals taken for this visit. Gen:   Awake, no distress   Resp:  Normal effort  Cardiac: 2+ DP pulses bilaterally MSK:   Difficulty exam in chair. Laying on left side with legs flexed at knees. Diffuse tenderness to thoracic, lumbar area and right hip, femur. Cannot lay on back to assess for shortening or rotation of legs. Other:    Medical Decision Making  Medically screening exam initiated at 8:30 PM.  Appropriate orders placed.  Liane Comber Lenhoff was informed that the remainder of the evaluation will be completed by another provider, this initial triage assessment does not replace that evaluation, and the importance of remaining in the ED until their evaluation is complete.  Fall, back pain, right hip pain   Sole Lengacher A, PA-C 02/09/21 2115    Terald Sleeper, MD 02/10/21 [phone]

## 2021-02-09 NOTE — ED Triage Notes (Signed)
Per EMS, tp from home by self.  Pt had a fall from a standing, c/o right back, hip and leg pain.  She is unable to move it.  Reports she took hydrocodone around 5:30pm but it did not relieve the pain.    142/80 94% 118HR

## 2021-02-10 ENCOUNTER — Inpatient Hospital Stay (HOSPITAL_COMMUNITY): Payer: PPO | Admitting: Anesthesiology

## 2021-02-10 ENCOUNTER — Encounter (HOSPITAL_COMMUNITY)
Admission: EM | Disposition: A | Discharge status: Skilled Nursing Facility | Payer: Self-pay | Source: Home / Self Care | Attending: Family Medicine

## 2021-02-10 ENCOUNTER — Inpatient Hospital Stay (HOSPITAL_COMMUNITY): Payer: PPO

## 2021-02-10 ENCOUNTER — Emergency Department (HOSPITAL_COMMUNITY): Payer: PPO

## 2021-02-10 ENCOUNTER — Encounter (HOSPITAL_COMMUNITY): Payer: Self-pay | Admitting: Internal Medicine

## 2021-02-10 DIAGNOSIS — Z9889 Other specified postprocedural states: Secondary | ICD-10-CM | POA: Diagnosis not present

## 2021-02-10 DIAGNOSIS — R9431 Abnormal electrocardiogram [ECG] [EKG]: Secondary | ICD-10-CM

## 2021-02-10 DIAGNOSIS — M25551 Pain in right hip: Secondary | ICD-10-CM | POA: Diagnosis not present

## 2021-02-10 DIAGNOSIS — S72001A Fracture of unspecified part of neck of right femur, initial encounter for closed fracture: Secondary | ICD-10-CM | POA: Diagnosis not present

## 2021-02-10 DIAGNOSIS — Z833 Family history of diabetes mellitus: Secondary | ICD-10-CM | POA: Diagnosis not present

## 2021-02-10 DIAGNOSIS — Z8249 Family history of ischemic heart disease and other diseases of the circulatory system: Secondary | ICD-10-CM | POA: Diagnosis not present

## 2021-02-10 DIAGNOSIS — S72011A Unspecified intracapsular fracture of right femur, initial encounter for closed fracture: Secondary | ICD-10-CM | POA: Diagnosis present

## 2021-02-10 DIAGNOSIS — S32010A Wedge compression fracture of first lumbar vertebra, initial encounter for closed fracture: Secondary | ICD-10-CM

## 2021-02-10 DIAGNOSIS — W010XXA Fall on same level from slipping, tripping and stumbling without subsequent striking against object, initial encounter: Secondary | ICD-10-CM | POA: Diagnosis present

## 2021-02-10 DIAGNOSIS — Z01810 Encounter for preprocedural cardiovascular examination: Secondary | ICD-10-CM

## 2021-02-10 DIAGNOSIS — Z20822 Contact with and (suspected) exposure to covid-19: Secondary | ICD-10-CM | POA: Diagnosis present

## 2021-02-10 DIAGNOSIS — M549 Dorsalgia, unspecified: Secondary | ICD-10-CM | POA: Diagnosis present

## 2021-02-10 DIAGNOSIS — S32019A Unspecified fracture of first lumbar vertebra, initial encounter for closed fracture: Secondary | ICD-10-CM | POA: Diagnosis present

## 2021-02-10 DIAGNOSIS — E785 Hyperlipidemia, unspecified: Secondary | ICD-10-CM | POA: Diagnosis present

## 2021-02-10 DIAGNOSIS — M545 Low back pain, unspecified: Secondary | ICD-10-CM | POA: Diagnosis not present

## 2021-02-10 DIAGNOSIS — G35 Multiple sclerosis: Secondary | ICD-10-CM | POA: Diagnosis present

## 2021-02-10 DIAGNOSIS — S72009A Fracture of unspecified part of neck of unspecified femur, initial encounter for closed fracture: Secondary | ICD-10-CM | POA: Diagnosis present

## 2021-02-10 DIAGNOSIS — F17210 Nicotine dependence, cigarettes, uncomplicated: Secondary | ICD-10-CM | POA: Diagnosis present

## 2021-02-10 DIAGNOSIS — D72829 Elevated white blood cell count, unspecified: Secondary | ICD-10-CM | POA: Diagnosis present

## 2021-02-10 DIAGNOSIS — M4856XA Collapsed vertebra, not elsewhere classified, lumbar region, initial encounter for fracture: Secondary | ICD-10-CM | POA: Diagnosis present

## 2021-02-10 HISTORY — PX: PERCUTANEOUS PINNING: SHX2209

## 2021-02-10 LAB — CBC
HCT: 39.8 % (ref 36.0–46.0)
Hemoglobin: 13.5 g/dL (ref 12.0–15.0)
MCH: 33.8 pg (ref 26.0–34.0)
MCHC: 33.9 g/dL (ref 30.0–36.0)
MCV: 99.7 fL (ref 80.0–100.0)
Platelets: 182 10*3/uL (ref 150–400)
RBC: 3.99 MIL/uL (ref 3.87–5.11)
RDW: 13 % (ref 11.5–15.5)
WBC: 9.9 10*3/uL (ref 4.0–10.5)
nRBC: 0 % (ref 0.0–0.2)

## 2021-02-10 LAB — BASIC METABOLIC PANEL
Anion gap: 8 (ref 5–15)
BUN: 8 mg/dL (ref 8–23)
CO2: 25 mmol/L (ref 22–32)
Calcium: 8.9 mg/dL (ref 8.9–10.3)
Chloride: 104 mmol/L (ref 98–111)
Creatinine, Ser: 0.48 mg/dL (ref 0.44–1.00)
GFR, Estimated: >60 mL/min (ref >60)
Glucose, Bld: 120 mg/dL — ABNORMAL HIGH (ref 70–99)
Potassium: 3.5 mmol/L (ref 3.5–5.1)
Sodium: 137 mmol/L (ref 135–145)

## 2021-02-10 LAB — RESP PANEL BY RT-PCR (FLU A&B, COVID) ARPGX2
Influenza A by PCR: NEGATIVE
Influenza B by PCR: NEGATIVE
SARS Coronavirus 2 by RT PCR: NEGATIVE

## 2021-02-10 LAB — SURGICAL PCR SCREEN
MRSA, PCR: NEGATIVE
Staphylococcus aureus: NEGATIVE

## 2021-02-10 LAB — HIV ANTIBODY (ROUTINE TESTING W REFLEX): HIV Screen 4th Generation wRfx: NONREACTIVE

## 2021-02-10 SURGERY — PINNING, EXTREMITY, PERCUTANEOUS
Anesthesia: General | Site: Hip | Laterality: Right

## 2021-02-10 MED ORDER — ACETAMINOPHEN 325 MG PO TABS: 325.0000 mg | ORAL_TABLET | Freq: Four times a day (QID) | ORAL | Status: DC | PRN

## 2021-02-10 MED ORDER — SODIUM CHLORIDE 0.9 % IV SOLN: INTRAVENOUS | Status: DC

## 2021-02-10 MED ORDER — DIPHENHYDRAMINE HCL 12.5 MG/5ML PO ELIX: 12.5000 mg | ORAL_SOLUTION | ORAL | Status: DC | PRN

## 2021-02-10 MED ORDER — MORPHINE SULFATE (PF) 4 MG/ML IV SOLN
4.0000 mg | Freq: Once | INTRAVENOUS | Status: AC
  Administered 2021-02-10: 4 mg via INTRAVENOUS
  Filled 2021-02-10: qty 1

## 2021-02-10 MED ORDER — CHLORHEXIDINE GLUCONATE 0.12 % MT SOLN
OROMUCOSAL | Status: AC
  Administered 2021-02-10: 15 mL
  Filled 2021-02-10: qty 15

## 2021-02-10 MED ORDER — CEFAZOLIN SODIUM-DEXTROSE 2-4 GM/100ML-% IV SOLN
2.0000 g | INTRAVENOUS | Status: AC
  Administered 2021-02-10: 2 g via INTRAVENOUS
  Filled 2021-02-10: qty 100

## 2021-02-10 MED ORDER — MORPHINE SULFATE (PF) 2 MG/ML IV SOLN
0.5000 mg | INTRAVENOUS | Status: DC | PRN
Start: 2021-02-10 — End: 2021-02-12
  Administered 2021-02-12: 0.5 mg via INTRAVENOUS
  Filled 2021-02-10: qty 1

## 2021-02-10 MED ORDER — ONDANSETRON HCL 4 MG PO TABS: 4.0000 mg | ORAL_TABLET | Freq: Four times a day (QID) | ORAL | Status: DC | PRN

## 2021-02-10 MED ORDER — POLYETHYLENE GLYCOL 3350 17 G PO PACK: 17.0000 g | PACK | Freq: Every day | ORAL | Status: DC | PRN

## 2021-02-10 MED ORDER — VITAMIN B-12 100 MCG PO TABS
100.0000 ug | ORAL_TABLET | Freq: Every day | ORAL | Status: DC
  Administered 2021-02-11 – 2021-02-17 (×6): 100 ug via ORAL
  Filled 2021-02-10 (×9): qty 1

## 2021-02-10 MED ORDER — PROPOFOL 10 MG/ML IV BOLUS
INTRAVENOUS | Status: DC | PRN
  Administered 2021-02-10: 100 mg via INTRAVENOUS

## 2021-02-10 MED ORDER — HYDROCODONE-ACETAMINOPHEN 5-325 MG PO TABS
1.0000 | ORAL_TABLET | ORAL | Status: DC | PRN
  Administered 2021-02-10: 1 via ORAL
  Administered 2021-02-11 – 2021-02-17 (×9): 2 via ORAL
  Filled 2021-02-10 (×3): qty 2
  Filled 2021-02-10: qty 1
  Filled 2021-02-10 (×6): qty 2

## 2021-02-10 MED ORDER — METOCLOPRAMIDE HCL 5 MG/ML IJ SOLN: 5.0000 mg | Freq: Three times a day (TID) | INTRAMUSCULAR | Status: DC | PRN

## 2021-02-10 MED ORDER — ONDANSETRON HCL 4 MG/2ML IJ SOLN: 4.0000 mg | Freq: Once | INTRAMUSCULAR | Status: AC

## 2021-02-10 MED ORDER — ASPIRIN 81 MG PO CHEW
81.0000 mg | CHEWABLE_TABLET | Freq: Two times a day (BID) | ORAL | Status: DC
  Administered 2021-02-11 (×2): 81 mg via ORAL
  Filled 2021-02-10 (×3): qty 1

## 2021-02-10 MED ORDER — GABAPENTIN 100 MG PO CAPS: 200.0000 mg | ORAL_CAPSULE | Freq: Every evening | ORAL | Status: DC | PRN

## 2021-02-10 MED ORDER — ONDANSETRON HCL 4 MG/2ML IJ SOLN
4.0000 mg | Freq: Once | INTRAMUSCULAR | Status: AC
  Administered 2021-02-10: 4 mg via INTRAVENOUS
  Filled 2021-02-10: qty 2

## 2021-02-10 MED ORDER — PROPOFOL 10 MG/ML IV BOLUS
INTRAVENOUS | Status: AC
  Filled 2021-02-10: qty 20

## 2021-02-10 MED ORDER — DEXAMETHASONE SODIUM PHOSPHATE 10 MG/ML IJ SOLN
INTRAMUSCULAR | Status: DC | PRN
  Administered 2021-02-10: 5 mg via INTRAVENOUS

## 2021-02-10 MED ORDER — ALBUMIN HUMAN 5 % IV SOLN
INTRAVENOUS | Status: AC
  Filled 2021-02-10: qty 250

## 2021-02-10 MED ORDER — BISACODYL 10 MG RE SUPP
10.0000 mg | Freq: Every day | RECTAL | Status: DC | PRN
  Administered 2021-02-15: 10 mg via RECTAL
  Filled 2021-02-10: qty 1

## 2021-02-10 MED ORDER — FENTANYL CITRATE (PF) 250 MCG/5ML IJ SOLN
INTRAMUSCULAR | Status: AC
  Filled 2021-02-10: qty 5

## 2021-02-10 MED ORDER — CEFAZOLIN SODIUM-DEXTROSE 2-4 GM/100ML-% IV SOLN
2.0000 g | Freq: Four times a day (QID) | INTRAVENOUS | Status: AC
  Administered 2021-02-11 (×2): 2 g via INTRAVENOUS
  Filled 2021-02-10 (×2): qty 100

## 2021-02-10 MED ORDER — AMISULPRIDE (ANTIEMETIC) 5 MG/2ML IV SOLN: 10.0000 mg | Freq: Once | INTRAVENOUS | Status: DC | PRN

## 2021-02-10 MED ORDER — LACTATED RINGERS IV SOLN: INTRAVENOUS | Status: DC

## 2021-02-10 MED ORDER — DULOXETINE HCL 60 MG PO CPEP
60.0000 mg | ORAL_CAPSULE | Freq: Every day | ORAL | Status: DC
  Administered 2021-02-11 – 2021-02-17 (×6): 60 mg via ORAL
  Filled 2021-02-10 (×7): qty 1

## 2021-02-10 MED ORDER — HYDROCODONE-ACETAMINOPHEN 7.5-325 MG PO TABS
1.0000 | ORAL_TABLET | ORAL | Status: DC | PRN
  Administered 2021-02-12: 1 via ORAL
  Administered 2021-02-12 – 2021-02-15 (×3): 2 via ORAL
  Administered 2021-02-15: 1 via ORAL
  Administered 2021-02-16 (×2): 2 via ORAL
  Filled 2021-02-10: qty 1
  Filled 2021-02-10: qty 2
  Filled 2021-02-10: qty 1
  Filled 2021-02-10 (×4): qty 2

## 2021-02-10 MED ORDER — MORPHINE SULFATE (PF) 2 MG/ML IV SOLN
1.0000 mg | INTRAVENOUS | Status: DC | PRN
  Administered 2021-02-10: 2 mg via INTRAVENOUS
  Filled 2021-02-10: qty 1

## 2021-02-10 MED ORDER — FENTANYL CITRATE (PF) 250 MCG/5ML IJ SOLN
INTRAMUSCULAR | Status: DC | PRN
  Administered 2021-02-10: 100 ug via INTRAVENOUS
  Administered 2021-02-10 (×2): 25 ug via INTRAVENOUS

## 2021-02-10 MED ORDER — METOCLOPRAMIDE HCL 5 MG PO TABS: 5.0000 mg | ORAL_TABLET | Freq: Three times a day (TID) | ORAL | Status: DC | PRN

## 2021-02-10 MED ORDER — CEFAZOLIN SODIUM-DEXTROSE 2-4 GM/100ML-% IV SOLN
INTRAVENOUS | Status: AC
  Filled 2021-02-10: qty 100

## 2021-02-10 MED ORDER — ONDANSETRON HCL 4 MG/2ML IJ SOLN
INTRAMUSCULAR | Status: DC | PRN
  Administered 2021-02-10: 4 mg via INTRAVENOUS

## 2021-02-10 MED ORDER — CALCIUM CARBONATE-VITAMIN D 500-200 MG-UNIT PO TABS
1.0000 | ORAL_TABLET | Freq: Every day | ORAL | Status: DC
  Administered 2021-02-11 – 2021-02-17 (×6): 1 via ORAL
  Filled 2021-02-10 (×7): qty 1

## 2021-02-10 MED ORDER — ROSUVASTATIN CALCIUM 5 MG PO TABS
5.0000 mg | ORAL_TABLET | Freq: Every day | ORAL | Status: DC
  Administered 2021-02-11 – 2021-02-17 (×6): 5 mg via ORAL
  Filled 2021-02-10 (×6): qty 1

## 2021-02-10 MED ORDER — PHENYLEPHRINE 40 MCG/ML (10ML) SYRINGE FOR IV PUSH (FOR BLOOD PRESSURE SUPPORT)
PREFILLED_SYRINGE | INTRAVENOUS | Status: DC | PRN
  Administered 2021-02-10: 120 ug via INTRAVENOUS
  Administered 2021-02-10 (×2): 80 ug via INTRAVENOUS
  Administered 2021-02-10: 120 ug via INTRAVENOUS

## 2021-02-10 MED ORDER — MORPHINE SULFATE (PF) 2 MG/ML IV SOLN
0.5000 mg | INTRAVENOUS | Status: DC | PRN
  Administered 2021-02-10 (×2): 1 mg via INTRAVENOUS
  Filled 2021-02-10 (×2): qty 1

## 2021-02-10 MED ORDER — PHENOL 1.4 % MT LIQD: 1.0000 | OROMUCOSAL | Status: DC | PRN

## 2021-02-10 MED ORDER — AMANTADINE HCL 100 MG PO CAPS
200.0000 mg | ORAL_CAPSULE | Freq: Every day | ORAL | Status: DC
  Administered 2021-02-11 – 2021-02-17 (×6): 200 mg via ORAL
  Filled 2021-02-10 (×9): qty 2

## 2021-02-10 MED ORDER — PHENYLEPHRINE HCL-NACL 20-0.9 MG/250ML-% IV SOLN
INTRAVENOUS | Status: DC | PRN
  Administered 2021-02-10: 25 ug/min via INTRAVENOUS

## 2021-02-10 MED ORDER — METHOCARBAMOL 500 MG PO TABS
ORAL_TABLET | ORAL | Status: AC
  Filled 2021-02-10: qty 1

## 2021-02-10 MED ORDER — METHOCARBAMOL 500 MG PO TABS
500.0000 mg | ORAL_TABLET | Freq: Four times a day (QID) | ORAL | Status: DC | PRN
  Administered 2021-02-10 – 2021-02-17 (×6): 500 mg via ORAL
  Filled 2021-02-10 (×6): qty 1

## 2021-02-10 MED ORDER — MENTHOL 3 MG MT LOZG: 1.0000 | LOZENGE | OROMUCOSAL | Status: DC | PRN

## 2021-02-10 MED ORDER — TRANEXAMIC ACID-NACL 1000-0.7 MG/100ML-% IV SOLN
1000.0000 mg | Freq: Once | INTRAVENOUS | Status: AC
  Administered 2021-02-10: 1000 mg via INTRAVENOUS
  Filled 2021-02-10: qty 100

## 2021-02-10 MED ORDER — SUCCINYLCHOLINE CHLORIDE 200 MG/10ML IV SOSY
PREFILLED_SYRINGE | INTRAVENOUS | Status: DC | PRN
  Administered 2021-02-10: 120 mg via INTRAVENOUS

## 2021-02-10 MED ORDER — FERROUS SULFATE 325 (65 FE) MG PO TABS
325.0000 mg | ORAL_TABLET | Freq: Three times a day (TID) | ORAL | Status: DC
  Administered 2021-02-11 – 2021-02-17 (×19): 325 mg via ORAL
  Filled 2021-02-10 (×19): qty 1

## 2021-02-10 MED ORDER — FENTANYL CITRATE (PF) 100 MCG/2ML IJ SOLN
INTRAMUSCULAR | Status: AC
  Filled 2021-02-10: qty 2

## 2021-02-10 MED ORDER — LIDOCAINE 2% (20 MG/ML) 5 ML SYRINGE
INTRAMUSCULAR | Status: DC | PRN
  Administered 2021-02-10: 60 mg via INTRAVENOUS

## 2021-02-10 MED ORDER — DEXAMETHASONE SODIUM PHOSPHATE 10 MG/ML IJ SOLN
10.0000 mg | Freq: Once | INTRAMUSCULAR | Status: AC
  Administered 2021-02-11: 10 mg via INTRAVENOUS
  Filled 2021-02-10: qty 1

## 2021-02-10 MED ORDER — MORPHINE SULFATE (PF) 2 MG/ML IV SOLN
0.5000 mg | INTRAVENOUS | Status: DC | PRN
  Administered 2021-02-10 (×2): 0.5 mg via INTRAVENOUS
  Filled 2021-02-10 (×2): qty 1

## 2021-02-10 MED ORDER — DIROXIMEL FUMARATE 231 MG PO CPDR: 462.0000 mg | DELAYED_RELEASE_CAPSULE | Freq: Two times a day (BID) | ORAL | Status: DC

## 2021-02-10 MED ORDER — ROCURONIUM BROMIDE 10 MG/ML (PF) SYRINGE
PREFILLED_SYRINGE | INTRAVENOUS | Status: DC | PRN
  Administered 2021-02-10: 20 mg via INTRAVENOUS

## 2021-02-10 MED ORDER — EPHEDRINE SULFATE-NACL 50-0.9 MG/10ML-% IV SOSY
PREFILLED_SYRINGE | INTRAVENOUS | Status: DC | PRN
  Administered 2021-02-10: 5 mg via INTRAVENOUS

## 2021-02-10 MED ORDER — ALBUMIN HUMAN 5 % IV SOLN
12.5000 g | Freq: Once | INTRAVENOUS | Status: AC
  Administered 2021-02-10: 12.5 g via INTRAVENOUS

## 2021-02-10 MED ORDER — METHOCARBAMOL 1000 MG/10ML IJ SOLN
500.0000 mg | Freq: Four times a day (QID) | INTRAVENOUS | Status: DC | PRN
  Filled 2021-02-10 (×2): qty 5

## 2021-02-10 MED ORDER — POVIDONE-IODINE 10 % EX SWAB
2.0000 "application " | Freq: Once | CUTANEOUS | Status: AC
  Administered 2021-02-10: 2 via TOPICAL

## 2021-02-10 MED ORDER — FENTANYL CITRATE (PF) 100 MCG/2ML IJ SOLN
25.0000 ug | INTRAMUSCULAR | Status: DC | PRN
  Administered 2021-02-10: 25 ug via INTRAVENOUS

## 2021-02-10 MED ORDER — CHLORHEXIDINE GLUCONATE 4 % EX LIQD: 60.0000 mL | Freq: Once | CUTANEOUS | Status: DC

## 2021-02-10 MED ORDER — ONDANSETRON HCL 4 MG/2ML IJ SOLN
INTRAMUSCULAR | Status: AC
  Administered 2021-02-10: 4 mg via INTRAVENOUS
  Filled 2021-02-10: qty 2

## 2021-02-10 MED ORDER — METHOCARBAMOL 1000 MG/10ML IJ SOLN
500.0000 mg | Freq: Three times a day (TID) | INTRAVENOUS | Status: DC | PRN
  Administered 2021-02-10: 500 mg via INTRAVENOUS
  Filled 2021-02-10: qty 500
  Filled 2021-02-10: qty 5

## 2021-02-10 MED ORDER — 0.9 % SODIUM CHLORIDE (POUR BTL) OPTIME
TOPICAL | Status: DC | PRN
  Administered 2021-02-10: 1000 mL

## 2021-02-10 MED ORDER — SENNA 8.6 MG PO TABS
1.0000 | ORAL_TABLET | Freq: Every day | ORAL | Status: DC
  Administered 2021-02-10: 8.6 mg via ORAL
  Filled 2021-02-10 (×2): qty 1

## 2021-02-10 MED ORDER — ONDANSETRON HCL 4 MG/2ML IJ SOLN: 4.0000 mg | Freq: Four times a day (QID) | INTRAMUSCULAR | Status: DC | PRN

## 2021-02-10 MED ORDER — TRAZODONE HCL 50 MG PO TABS
50.0000 mg | ORAL_TABLET | Freq: Every evening | ORAL | Status: DC | PRN
  Filled 2021-02-10: qty 1

## 2021-02-10 MED ORDER — POLYETHYLENE GLYCOL 3350 17 G PO PACK: 17.0000 g | PACK | Freq: Two times a day (BID) | ORAL | Status: DC

## 2021-02-10 MED ORDER — SUGAMMADEX SODIUM 200 MG/2ML IV SOLN
INTRAVENOUS | Status: DC | PRN
  Administered 2021-02-10: 200 mg via INTRAVENOUS

## 2021-02-10 MED ORDER — DOCUSATE SODIUM 100 MG PO CAPS
100.0000 mg | ORAL_CAPSULE | Freq: Two times a day (BID) | ORAL | Status: DC
  Administered 2021-02-10 – 2021-02-15 (×8): 100 mg via ORAL
  Filled 2021-02-10 (×12): qty 1

## 2021-02-10 SURGICAL SUPPLY — 59 items
ADH SKN CLS APL DERMABOND .7 (GAUZE/BANDAGES/DRESSINGS) ×1
APL SKNCLS STERI-STRIP NONHPOA (GAUZE/BANDAGES/DRESSINGS)
BAG COUNTER SPONGE SURGICOUNT (BAG) ×2 IMPLANT
BAG SPNG CNTER NS LX DISP (BAG) ×1
BENZOIN TINCTURE PRP APPL 2/3 (GAUZE/BANDAGES/DRESSINGS) IMPLANT
BIT DRILL 4.8X200 CANN (BIT) ×1 IMPLANT
BLADE CLIPPER SURG (BLADE) IMPLANT
BNDG ELASTIC 3X5.8 VLCR STR LF (GAUZE/BANDAGES/DRESSINGS) ×3 IMPLANT
BNDG ELASTIC 4X5.8 VLCR STR LF (GAUZE/BANDAGES/DRESSINGS) IMPLANT
BNDG GAUZE ELAST 4 BULKY (GAUZE/BANDAGES/DRESSINGS) ×3 IMPLANT
COVER SURGICAL LIGHT HANDLE (MISCELLANEOUS) ×2 IMPLANT
CUFF TOURN SGL QUICK 18X4 (TOURNIQUET CUFF) IMPLANT
CUFF TOURN SGL QUICK 24 (TOURNIQUET CUFF)
CUFF TRNQT CYL 24X4X16.5-23 (TOURNIQUET CUFF) IMPLANT
DERMABOND ADVANCED (GAUZE/BANDAGES/DRESSINGS) ×1
DERMABOND ADVANCED .7 DNX12 (GAUZE/BANDAGES/DRESSINGS) IMPLANT
DRSG AQUACEL AG 3.5X4 (GAUZE/BANDAGES/DRESSINGS) ×1 IMPLANT
DRSG AQUACEL AG ADV 3.5X 6 (GAUZE/BANDAGES/DRESSINGS) ×1 IMPLANT
DRSG EMULSION OIL 3X3 NADH (GAUZE/BANDAGES/DRESSINGS) IMPLANT
DURAPREP 26ML APPLICATOR (WOUND CARE) ×2 IMPLANT
GAUZE SPONGE 4X4 12PLY STRL (GAUZE/BANDAGES/DRESSINGS) IMPLANT
GAUZE XEROFORM 1X8 LF (GAUZE/BANDAGES/DRESSINGS) IMPLANT
GLOVE BIO SURGEON STRL SZ7 (GLOVE) ×1 IMPLANT
GLOVE OPTIFIT SS 6.0 STRL BRWN (GLOVE) ×1 IMPLANT
GLOVE SURG ENC MOIS LTX SZ6.5 (GLOVE) ×1 IMPLANT
GLOVE SURG ORTHO LTX SZ7.5 (GLOVE) ×2 IMPLANT
GLOVE SURG ORTHO LTX SZ8 (GLOVE) ×2 IMPLANT
GLOVE SURG UNDER POLY LF SZ7.5 (GLOVE) ×2 IMPLANT
GOWN STRL REUS W/ TWL LRG LVL3 (GOWN DISPOSABLE) ×2 IMPLANT
GOWN STRL REUS W/TWL LRG LVL3 (GOWN DISPOSABLE) ×4
GUIDE PIN DRILL TIP 2.8X450HIP (PIN) ×6
KIT BASIN OR (CUSTOM PROCEDURE TRAY) ×2 IMPLANT
KIT TURNOVER KIT B (KITS) ×2 IMPLANT
MANIFOLD NEPTUNE II (INSTRUMENTS) ×2 IMPLANT
NS IRRIG 1000ML POUR BTL (IV SOLUTION) ×1 IMPLANT
PACK ORTHO EXTREMITY (CUSTOM PROCEDURE TRAY) ×2 IMPLANT
PAD ARMBOARD 7.5X6 YLW CONV (MISCELLANEOUS) ×4 IMPLANT
PIN GUIDE DRILL TIP 2.8X450HIP (PIN) IMPLANT
SCREW 8.0X80MMX16 (Screw) ×1 IMPLANT
SCREW CANN 8.0X70 16 THRD (Screw) IMPLANT
SCREW CANNULATED 8.0X70MM (Screw) ×2 IMPLANT
SCREW CANNULATED 8.0X75MM (Screw) ×2 IMPLANT
SCREW CANNULATED 8.8X80MM HIP (Screw) ×1 IMPLANT
SCREW CANNULATED HIP 8.0X75MM (Screw) ×1 IMPLANT
STRIP CLOSURE SKIN 1/2X4 (GAUZE/BANDAGES/DRESSINGS) IMPLANT
SUT ETHILON 4 0 P 3 18 (SUTURE) IMPLANT
SUT ETHILON 5 0 P 3 18 (SUTURE)
SUT MNCRL AB 4-0 PS2 18 (SUTURE) ×1 IMPLANT
SUT NYLON ETHILON 5-0 P-3 1X18 (SUTURE) IMPLANT
SUT PROLENE 4 0 P 3 18 (SUTURE) IMPLANT
SUT VIC AB 0 CT1 27 (SUTURE) ×2
SUT VIC AB 0 CT1 27XBRD ANBCTR (SUTURE) IMPLANT
SUT VIC AB 1 CT1 27 (SUTURE) ×2
SUT VIC AB 1 CT1 27XBRD ANBCTR (SUTURE) IMPLANT
SUT VIC AB 2-0 CT1 27 (SUTURE) ×2
SUT VIC AB 2-0 CT1 TAPERPNT 27 (SUTURE) IMPLANT
TOWEL GREEN STERILE (TOWEL DISPOSABLE) ×2 IMPLANT
TOWEL GREEN STERILE FF (TOWEL DISPOSABLE) ×2 IMPLANT
WATER STERILE IRR 1000ML POUR (IV SOLUTION) ×1 IMPLANT

## 2021-02-10 NOTE — Progress Notes (Addendum)
PROGRESS NOTE    Carrie Martinez  HBZ:[id number] DOB: 05/26/1951 DOA: 02/09/2021 PCP: Marlyn Corporal, PA   Brief Narrative: 70 year old with past medical history significant for MS, she uses a walker to ambulate, had a mechanical fall and was found to have right hip fracture and L1 compression fracture.  Cardiology has been consulted for preop clearance due to abnormal EKG.  Plan is for surgery today by Dr. Charlann Boxer.     Assessment & Plan:   Active Problems:   Closed right hip fracture, initial encounter (HCC)   Closed compression fracture of body of L1 vertebra (HCC)   Multiple sclerosis (HCC)   Hip fracture (HCC)  1-Right hip fracture: -In the setting of mechanical fall. -Cardiology consulted for preop clearance. -Plan for surgery this afternoon by Dr. Charlann Boxer.  -Continue with IV morphine as needed for pain control. -Place foley catheter for retention.   2-L1 compression fracture: Dr Charlann Boxer will ask Dr Shon Baton to review L 1 compression fracture.   3-Leukocytosis: Reactive. Resolved.   4-MS: Plan to resume amantadine, Diroximel.  Continue with Cymbalta.   5-HLD; Resume Crestor.      Estimated body mass index is 19.97 kg/m as calculated from the following:   Height as of this encounter: 5\' 5"  (1.651 m).   Weight as of this encounter: 54.4 kg.   DVT prophylaxis: SCD, will need to start DVT prophylaxis post Sx Code Status: Full Code Family Communication: Care discussed with Son who was at bedside.  Disposition Plan:  Status is: Inpatient  Remains inpatient appropriate because:IV treatments appropriate due to intensity of illness or inability to take PO  Dispo: The patient is from: Home              Anticipated d/c is to: SNF              Patient currently is not medically stable to d/c.   Difficult to place patient No        Consultants:  Cardiology Othropedic, Dr .   Procedures:  None  Antimicrobials:    Subjective: She is alert, complaining of  right hip pain. She denies chest pain or dyspnea. She report some SOB on exertion.   Objective: Vitals:   02/10/21 0450 02/10/21 0645 02/10/21 0745 02/10/21 0830  BP:  (!) 110/52 (!) 102/51 (!) 109/45  Pulse:  73 77 81  Resp:  (!) 21 (!) 26 (!) 26  Temp: 98.6 F (37 C)     TempSrc: Oral     SpO2:  91% 92% 93%  Weight:      Height:       No intake or output data in the 24 hours ending 02/10/21 0950 Filed Weights   02/09/21 2114  Weight: 54.4 kg    Examination:  General exam: Appears calm and comfortable  Respiratory system: Clear to auscultation. Respiratory effort normal. Cardiovascular system: S1 & S2 heard, RRR.  Gastrointestinal system: Abdomen is nondistended, soft and nontender. No organomegaly or masses felt. Normal bowel sounds heard. Central nervous system: Alert and oriented. Extremities: asymmetric LE   Data Reviewed: I have personally reviewed following labs and imaging studies  CBC: Recent Labs  Lab 02/09/21 2123 02/10/21 0457  WBC 15.9* 9.9  NEUTROABS 13.3*  --   HGB 14.2 13.5  HCT 41.9 39.8  MCV 100.2* 99.7  PLT 199 182   Basic Metabolic Panel: Recent Labs  Lab 02/09/21 2123 02/10/21 0457  NA 138 137  K 3.6 3.5  CL 103  104  CO2 22 25  GLUCOSE 110* 120*  BUN 11 8  CREATININE 0.56 0.48  CALCIUM 9.1 8.9   GFR: Estimated Creatinine Clearance: 57 mL/min (by C-G formula based on SCr of 0.48 mg/dL). Liver Function Tests: No results for input(s): AST, ALT, ALKPHOS, BILITOT, PROT, ALBUMIN in the last 168 hours. No results for input(s): LIPASE, AMYLASE in the last 168 hours. No results for input(s): AMMONIA in the last 168 hours. Coagulation Profile: No results for input(s): INR, PROTIME in the last 168 hours. Cardiac Enzymes: No results for input(s): CKTOTAL, CKMB, CKMBINDEX, TROPONINI in the last 168 hours. BNP (last 3 results) No results for input(s): PROBNP in the last 8760 hours. HbA1C: No results for input(s): HGBA1C in the last 72  hours. CBG: No results for input(s): GLUCAP in the last 168 hours. Lipid Profile: No results for input(s): CHOL, HDL, LDLCALC, TRIG, CHOLHDL, LDLDIRECT in the last 72 hours. Thyroid Function Tests: No results for input(s): TSH, T4TOTAL, FREET4, T3FREE, THYROIDAB in the last 72 hours. Anemia Panel: No results for input(s): VITAMINB12, FOLATE, FERRITIN, TIBC, IRON, RETICCTPCT in the last 72 hours. Sepsis Labs: No results for input(s): PROCALCITON, LATICACIDVEN in the last 168 hours.  Recent Results (from the past 240 hour(s))  Resp Panel by RT-PCR (Flu A&B, Covid) Nasopharyngeal Swab     Status: None   Collection Time: 02/10/21 12:03 AM   Specimen: Nasopharyngeal Swab; Nasopharyngeal(NP) swabs in vial transport medium  Result Value Ref Range Status   SARS Coronavirus 2 by RT PCR NEGATIVE NEGATIVE Final    Comment: (NOTE) SARS-CoV-2 target nucleic acids are NOT DETECTED.  The SARS-CoV-2 RNA is generally detectable in upper respiratory specimens during the acute phase of infection. The lowest concentration of SARS-CoV-2 viral copies this assay can detect is 138 copies/mL. A negative result does not preclude SARS-Cov-2 infection and should not be used as the sole basis for treatment or other patient management decisions. A negative result may occur with  improper specimen collection/handling, submission of specimen other than nasopharyngeal swab, presence of viral mutation(s) within the areas targeted by this assay, and inadequate number of viral copies(<138 copies/mL). A negative result must be combined with clinical observations, patient history, and epidemiological information. The expected result is Negative.  Fact Sheet for Patients:  BloggerCourse.comhttps://www.fda.gov/media/152166/download  Fact Sheet for Healthcare Providers:  SeriousBroker.ithttps://www.fda.gov/media/152162/download  This test is no t yet approved or cleared by the [place] FDA and  has been authorized for detection and/or  diagnosis of SARS-CoV-2 by FDA under an Emergency Use Authorization (EUA). This EUA will remain  in effect (meaning this test can be used) for the duration of the COVID-19 declaration under Section 564(b)(1) of the Act, 21 U.S.C.section 360bbb-3(b)(1), unless the authorization is terminated  or revoked sooner.       Influenza A by PCR NEGATIVE NEGATIVE Final   Influenza B by PCR NEGATIVE NEGATIVE Final    Comment: (NOTE) The Xpert Xpress SARS-CoV-2/FLU/RSV plus assay is intended as an aid in the diagnosis of influenza from Nasopharyngeal swab specimens and should not be used as a sole basis for treatment. Nasal washings and aspirates are unacceptable for Xpert Xpress SARS-CoV-2/FLU/RSV testing.  Fact Sheet for Patients: BloggerCourse.comhttps://www.fda.gov/media/152166/download  Fact Sheet for Healthcare Providers: SeriousBroker.ithttps://www.fda.gov/media/152162/download  This test is not yet approved or cleared by the [place] FDA and has been authorized for detection and/or diagnosis of SARS-CoV-2 by FDA under an Emergency Use Authorization (EUA). This EUA will remain in effect (meaning this test can be used)  for the duration of the COVID-19 declaration under Section 564(b)(1) of the Act, 21 U.S.C. section 360bbb-3(b)(1), unless the authorization is terminated or revoked.  Performed at [facility], [address], [place], [place] [address]          Radiology Studies: DG Thoracic Spine 2 View  Result Date: 02/09/2021 CLINICAL DATA:  Status post fall. EXAM: THORACIC SPINE 2 VIEWS COMPARISON:  November 16, 2019 FINDINGS: There is no evidence of an acute thoracic spine fracture. Stable loss of vertebral body height is seen at the approximate level of T11. An ill-defined deformity is seen involving the superior endplate of the L1 vertebral body on the frontal view. This is not clearly visualized on the prior study. Alignment is normal. No other significant bone abnormalities are identified.  IMPRESSION: Ill-defined deformity involving the superior endplate of the L1 vertebral body which may be chronic in nature. A mild acute fracture cannot be excluded. CT correlation is recommended. Electronically Signed   By: Aram Candela M.D.   On: 02/09/2021 22:21   DG Lumbar Spine Complete  Result Date: 02/09/2021 CLINICAL DATA:  Status post fall. EXAM: LUMBAR SPINE - COMPLETE 4+ VIEW COMPARISON:  November 16, 2019 FINDINGS: An acute compression fracture deformity is seen involving the L1 vertebral body. Mild levoscoliosis is noted. Mild multilevel endplate sclerosis is present. Intervertebral disc spaces are maintained. IMPRESSION: 1. Acute compression fracture of the L1 vertebral body. MRI correlation is recommended. Electronically Signed   By: Aram Candela M.D.   On: 02/09/2021 22:24   DG Pelvis 1-2 Views  Result Date: 02/09/2021 CLINICAL DATA:  Status post fall. EXAM: PELVIS - 1-2 VIEW COMPARISON:  None. FINDINGS: Evaluation of the right femur is limited secondary to patient positioning. There is no evidence of pelvic fracture or diastasis. Degenerative changes are seen involving both hips in the form of joint space narrowing and acetabular sclerosis. No pelvic bone lesions are seen. IMPRESSION: 1. No acute findings. Electronically Signed   By: Aram Candela M.D.   On: 02/09/2021 22:18   CT HEAD WO CONTRAST ( )  Result Date: 02/10/2021 CLINICAL DATA:  Fall from standing EXAM: CT HEAD WITHOUT CONTRAST CT CERVICAL SPINE WITHOUT CONTRAST TECHNIQUE: Multidetector CT imaging of the head and cervical spine was performed following the standard protocol without intravenous contrast. Multiplanar CT image reconstructions of the cervical spine were also generated. COMPARISON:  None. FINDINGS: CT HEAD FINDINGS Brain: No evidence of acute infarction, hemorrhage, hydrocephalus, extra-axial collection or mass lesion/mass effect. Mild degenerative changes of the visualized thoracolumbar spine. Vascular:  No hyperdense vessel or unexpected calcification. Skull: Normal. Negative for fracture or focal lesion. Sinuses/Orbits: The visualized paranasal sinuses are essentially clear. The mastoid air cells are unopacified. Other: None. CT CERVICAL SPINE FINDINGS Alignment: Normal cervical lordosis. Skull base and vertebrae: No acute fracture. No primary bone lesion or focal pathologic process. Soft tissues and spinal canal: No prevertebral fluid or swelling. No visible canal hematoma. Disc levels: Mild degenerative changes of the mid cervical spine. Spinal canal is patent. Upper chest: Visualized lung apices are notable for biapical pleural-parenchymal scarring and mild centrilobular and paraseptal emphysematous changes. Other: Visualized thyroid is notable for a 14 mm right thyroid nodule. Not clinically significant; no follow-up imaging recommended (ref: J Am Coll Radiol. 2015 Feb;12(2): 143-50). IMPRESSION: No evidence of acute intracranial abnormality. Mild small vessel ischemic changes. No evidence of traumatic injury to the cervical spine. Mild degenerative changes. Electronically Signed   By: Roselie Awkward.D.  On: 02/10/2021 01:33   CT Cervical Spine Wo Contrast  Result Date: 02/10/2021 CLINICAL DATA:  Fall from standing EXAM: CT HEAD WITHOUT CONTRAST CT CERVICAL SPINE WITHOUT CONTRAST TECHNIQUE: Multidetector CT imaging of the head and cervical spine was performed following the standard protocol without intravenous contrast. Multiplanar CT image reconstructions of the cervical spine were also generated. COMPARISON:  None. FINDINGS: CT HEAD FINDINGS Brain: No evidence of acute infarction, hemorrhage, hydrocephalus, extra-axial collection or mass lesion/mass effect. Mild degenerative changes of the visualized thoracolumbar spine. Vascular: No hyperdense vessel or unexpected calcification. Skull: Normal. Negative for fracture or focal lesion. Sinuses/Orbits: The visualized paranasal sinuses are essentially  clear. The mastoid air cells are unopacified. Other: None. CT CERVICAL SPINE FINDINGS Alignment: Normal cervical lordosis. Skull base and vertebrae: No acute fracture. No primary bone lesion or focal pathologic process. Soft tissues and spinal canal: No prevertebral fluid or swelling. No visible canal hematoma. Disc levels: Mild degenerative changes of the mid cervical spine. Spinal canal is patent. Upper chest: Visualized lung apices are notable for biapical pleural-parenchymal scarring and mild centrilobular and paraseptal emphysematous changes. Other: Visualized thyroid is notable for a 14 mm right thyroid nodule. Not clinically significant; no follow-up imaging recommended (ref: J Am Coll Radiol. 2015 Feb;12(2): 143-50). IMPRESSION: No evidence of acute intracranial abnormality. Mild small vessel ischemic changes. No evidence of traumatic injury to the cervical spine. Mild degenerative changes. Electronically Signed   By: Charline Bills M.D.   On: 02/10/2021 01:33   CT Lumbar Spine Wo Contrast  Result Date: 02/10/2021 CLINICAL DATA:  Fall, low back pain EXAM: CT LUMBAR SPINE WITHOUT CONTRAST TECHNIQUE: Multidetector CT imaging of the lumbar spine was performed without intravenous contrast administration. Multiplanar CT image reconstructions were also generated. COMPARISON:  None. FINDINGS: Segmentation: 5 lumbar-type vertebral bodies. Alignment: Normal lumbar lordosis. Vertebrae: Mild to moderate superior endplate compression fracture deformity at L1, with approximately 40% loss of height (sagittal image 37), and minimal retropulsion (sagittal image 35). Vertebral body heights are otherwise maintained. Inferior endplate Schmorl's node deformity at L5 (sagittal image 38). Paraspinal and other soft tissues: Mild atherosclerotic calcifications of the aortic arch and iliac arteries. Otherwise unremarkable. Disc levels: Mild degenerative changes at L2-3 and L5-S1. IMPRESSION: Mild to moderate superior endplate  compression fracture deformity at L1, with approximately 40% loss of height, and minimal retropulsion. Electronically Signed   By: Charline Bills M.D.   On: 02/10/2021 01:36   CT Hip Right Wo Contrast  Result Date: 02/10/2021 CLINICAL DATA:  Fall, right hip pain EXAM: CT OF THE RIGHT HIP WITHOUT CONTRAST TECHNIQUE: Multidetector CT imaging of the right hip was performed according to the standard protocol. Multiplanar CT image reconstructions were also generated. COMPARISON:  Right femur radiographs dated 02/09/2021 FINDINGS: Subcapital right hip fracture, impacted, with foreshortening. Mild subcutaneous stranding overlying the greater trochanter (series 5/image 64). No discrete hematoma. Visualized bony pelvis is intact. Right hip joint space is preserved. Soft tissue pelvis is unremarkable.  No pelvic ascites. IMPRESSION: Impacted subcapital right hip fracture, as above. Electronically Signed   By: Charline Bills M.D.   On: 02/10/2021 01:41   DG Femur Min 2 Views Right  Result Date: 02/09/2021 CLINICAL DATA:  Status post fall. EXAM: RIGHT FEMUR 2 VIEWS COMPARISON:  None. FINDINGS: An impacted fracture deformity of indeterminate age is seen involving the junction of the superior aspect of the right femoral head and neck. There is no evidence of dislocation. Degenerative changes are seen in the form of joint space  narrowing and acetabular sclerosis. Soft tissues are unremarkable. IMPRESSION: 1. Impacted fracture deformity of the right femoral head and neck. CT correlation is recommended. Electronically Signed   By: Aram Candela M.D.   On: 02/09/2021 22:22        Scheduled Meds: Continuous Infusions:  lactated ringers       LOS: 0 days    Time spent: 35 minutes.     Alba Cory, MD [facility]   If 7PM-7AM, please contact night-coverage [email or web address]  02/10/2021, 9:50 AM

## 2021-02-10 NOTE — Anesthesia Procedure Notes (Signed)
Procedure Name: Intubation Date/Time: 02/10/2021 7:02 PM Performed by: Dorthea Cove, CRNA Pre-anesthesia Checklist: Patient identified, Emergency Drugs available, Suction available and Patient being monitored Patient Re-evaluated:Patient Re-evaluated prior to induction Oxygen Delivery Method: Circle system utilized Preoxygenation: Pre-oxygenation with 100% oxygen Induction Type: IV induction, Cricoid Pressure applied and Rapid sequence Laryngoscope Size: Mac and 3 Grade View: Grade I Tube type: Oral Tube size: 7.0 mm Number of attempts: 1 Airway Equipment and Method: Stylet and Oral airway Placement Confirmation: ETT inserted through vocal cords under direct vision, positive ETCO2 and breath sounds checked- equal and bilateral Secured at: 21 cm Tube secured with: Tape Dental Injury: Teeth and Oropharynx as per pre-operative assessment

## 2021-02-10 NOTE — ED Notes (Signed)
Pt transported to CT ?

## 2021-02-10 NOTE — Anesthesia Preprocedure Evaluation (Signed)
Anesthesia Evaluation  Patient identified by MRN, date of birth, ID band Patient awake    Reviewed: Allergy & Precautions, NPO status , Patient's Chart, lab work & pertinent test results  Airway Mallampati: II  TM Distance: >3 FB Neck ROM: Full    Dental  (+) Dental Advisory Given   Pulmonary Current Smoker and Patient abstained from smoking.,    breath sounds clear to auscultation       Cardiovascular negative cardio ROS   Rhythm:Regular Rate:Normal     Neuro/Psych  Neuromuscular disease (MS)    GI/Hepatic negative GI ROS, Neg liver ROS,   Endo/Other  negative endocrine ROS  Renal/GU negative Renal ROS     Musculoskeletal   Abdominal   Peds  Hematology negative hematology ROS (+)   Anesthesia Other Findings   Reproductive/Obstetrics                             Lab Results  Component Value Date   WBC 9.9 02/10/2021   HGB 13.5 02/10/2021   HCT 39.8 02/10/2021   MCV 99.7 02/10/2021   PLT 182 02/10/2021   Lab Results  Component Value Date   CREATININE 0.48 02/10/2021   BUN 8 02/10/2021   NA 137 02/10/2021   K 3.5 02/10/2021   CL 104 02/10/2021   CO2 25 02/10/2021    Anesthesia Physical Anesthesia Plan  ASA: 3  Anesthesia Plan: General   Post-op Pain Management:    Induction: Intravenous  PONV Risk Score and Plan: 2 and Dexamethasone, Ondansetron and Treatment may vary due to age or medical condition  Airway Management Planned: Oral ETT  Additional Equipment: None  Intra-op Plan:   Post-operative Plan: Extubation in OR  Informed Consent: I have reviewed the patients History and Physical, chart, labs and discussed the procedure including the risks, benefits and alternatives for the proposed anesthesia with the patient or authorized representative who has indicated his/her understanding and acceptance.     Dental advisory given  Plan Discussed with:    Anesthesia Plan Comments:         Anesthesia Quick Evaluation

## 2021-02-10 NOTE — Interval H&P Note (Signed)
History and Physical Interval Note:  02/10/2021 6:11 PM  Liane Comber Rivenbark  has presented today for surgery, with the diagnosis of Right hip Fx.  The various methods of treatment have been discussed with the patient and family. After consideration of risks, benefits and other options for treatment, the patient has consented to  Procedure(s): PERCUTANEOUS PINNING (Right) as a surgical intervention.  The patient's history has been reviewed, patient examined, no change in status, stable for surgery.  I have reviewed the patient's chart and labs.  Questions were answered to the patient's satisfaction.     Shelda Pal

## 2021-02-10 NOTE — Consult Note (Addendum)
Reason for Consult: 1. Right hip fracture, 2. L1 compression fracture Referring Physician: Sunnie Nielsen, MD  Carrie Martinez is an 70 y.o. female.  HPI: Carrie Martinez is a 70 y.o. female with history of multiple sclerosis uses a walker to ambulate had a fall when patient was trying to reach a smoke detector with a stick and lost balance and fell onto her right side hitting her stove and falling to the ground.  Denies losing consciousness.  Back hurting more than the right hip    ED Course: In the ER x-rays and CT scans were done which shows right hip fracture and L1 compression fracture.  Labs show WBC count of 15.9 patient afebrile COVID test negative.  Lives alone Son lives in [place] Dependent on walker for mobilization    Past Medical History:  Diagnosis Date   Multiple sclerosis [facility])     Past Surgical History:  Procedure Laterality Date   TUBAL LIGATION      Family History  Problem Relation Age of Onset   Diabetes type II Mother    CAD Mother    CAD Father     Social History:  reports that she has been smoking cigarettes. She has never used smokeless tobacco. She reports that she does not drink alcohol and does not use drugs.  Allergies: No Known Allergies  Medications: I have reviewed the patient's current medications. Scheduled:  Results for orders placed or performed during the hospital encounter of 02/09/21 (from the past 24 hour(s))  CBC with Differential     Status: Abnormal   Collection Time: 02/09/21  9:23 PM  Result Value Ref Range   WBC 15.9 (H) 4.0 - 10.5 K/uL   RBC 4.18 3.87 - 5.11 MIL/uL   Hemoglobin 14.2 12.0 - 15.0 g/dL   HCT 22.0 25.4 - 27.0 %   MCV 100.2 (H) 80.0 - 100.0 fL   MCH 34.0 26.0 - 34.0 pg   MCHC 33.9 30.0 - 36.0 g/dL   RDW 62.3 76.2 - 83.1 %   Platelets 199 150 - 400 K/uL   nRBC 0.0 0.0 - 0.2 %   Neutrophils Relative % 84 %   Neutro Abs 13.3 (H) 1.7 - 7.7 K/uL   Lymphocytes Relative 7 %   Lymphs Abs 1.1 0.7 - 4.0 K/uL    Monocytes Relative 8 %   Monocytes Absolute 1.3 (H) 0.1 - 1.0 K/uL   Eosinophils Relative 0 %   Eosinophils Absolute 0.0 0.0 - 0.5 K/uL   Basophils Relative 0 %   Basophils Absolute 0.1 0.0 - 0.1 K/uL   Immature Granulocytes 1 %   Abs Immature Granulocytes 0.10 (H) 0.00 - 0.07 K/uL  Basic metabolic panel     Status: Abnormal   Collection Time: 02/09/21  9:23 PM  Result Value Ref Range   Sodium 138 135 - 145 mmol/L   Potassium 3.6 3.5 - 5.1 mmol/L   Chloride 103 98 - 111 mmol/L   CO2 22 22 - 32 mmol/L   Glucose, Bld 110 (H) 70 - 99 mg/dL   BUN 11 8 - 23 mg/dL   Creatinine, Ser 5.17 0.44 - 1.00 mg/dL   Calcium 9.1 8.9 - 61.6 mg/dL   GFR, Estimated >07 >37 mL/min   Anion gap 13 5 - 15  Resp Panel by RT-PCR (Flu A&B, Covid) Nasopharyngeal Swab     Status: None   Collection Time: 02/10/21 12:03 AM   Specimen: Nasopharyngeal Swab; Nasopharyngeal(NP) swabs in vial transport medium  Result Value Ref Range   SARS Coronavirus 2 by RT PCR NEGATIVE NEGATIVE   Influenza A by PCR NEGATIVE NEGATIVE   Influenza B by PCR NEGATIVE NEGATIVE  CBC     Status: None   Collection Time: 02/10/21  4:57 AM  Result Value Ref Range   WBC 9.9 4.0 - 10.5 K/uL   RBC 3.99 3.87 - 5.11 MIL/uL   Hemoglobin 13.5 12.0 - 15.0 g/dL   HCT 71.6 96.7 - 89.3 %   MCV 99.7 80.0 - 100.0 fL   MCH 33.8 26.0 - 34.0 pg   MCHC 33.9 30.0 - 36.0 g/dL   RDW 81.0 17.5 - 10.2 %   Platelets 182 150 - 400 K/uL   nRBC 0.0 0.0 - 0.2 %  Basic metabolic panel     Status: Abnormal   Collection Time: 02/10/21  4:57 AM  Result Value Ref Range   Sodium 137 135 - 145 mmol/L   Potassium 3.5 3.5 - 5.1 mmol/L   Chloride 104 98 - 111 mmol/L   CO2 25 22 - 32 mmol/L   Glucose, Bld 120 (H) 70 - 99 mg/dL   BUN 8 8 - 23 mg/dL   Creatinine, Ser 5.85 0.44 - 1.00 mg/dL   Calcium 8.9 8.9 - 27.7 mg/dL   GFR, Estimated >82 >42 mL/min   Anion gap 8 5 - 15     X-ray: CLINICAL DATA:  Fall, right hip pain   EXAM: CT OF THE RIGHT HIP WITHOUT  CONTRAST   TECHNIQUE: Multidetector CT imaging of the right hip was performed according to the standard protocol. Multiplanar CT image reconstructions were also generated.   COMPARISON:  Right femur radiographs dated 02/09/2021   FINDINGS: Subcapital right hip fracture, impacted, with foreshortening. Mild subcutaneous stranding overlying the greater trochanter (series 5/image 64). No discrete hematoma.   Visualized bony pelvis is intact. Right hip joint space is preserved.   Soft tissue pelvis is unremarkable.  No pelvic ascites.   IMPRESSION: Impacted subcapital right hip fracture, as above.     Electronically Signed   By: Charline Bills M.D.  CLINICAL DATA:  Fall, low back pain   EXAM: CT LUMBAR SPINE WITHOUT CONTRAST   TECHNIQUE: Multidetector CT imaging of the lumbar spine was performed without intravenous contrast administration. Multiplanar CT image reconstructions were also generated.   COMPARISON:  None.   FINDINGS: Segmentation: 5 lumbar-type vertebral bodies.   Alignment: Normal lumbar lordosis.   Vertebrae: Mild to moderate superior endplate compression fracture deformity at L1, with approximately 40% loss of height (sagittal image 37), and minimal retropulsion (sagittal image 35).   Vertebral body heights are otherwise maintained. Inferior endplate Schmorl's node deformity at L5 (sagittal image 38).   Paraspinal and other soft tissues: Mild atherosclerotic calcifications of the aortic arch and iliac arteries. Otherwise unremarkable.   Disc levels: Mild degenerative changes at L2-3 and L5-S1.   IMPRESSION: Mild to moderate superior endplate compression fracture deformity at L1, with approximately 40% loss of height, and minimal retropulsion.     Electronically Signed   By: Charline Bills M.D.  ROS: As per HPI History of MS noting that her Neurologist raised concerns of progression  Blood pressure (!) 110/52, pulse 73, temperature  98.6 F (37 C), temperature source Oral, resp. rate (!) 21, height 5\' 5"  (1.651 m), weight 54.4 kg, SpO2 91 %.  Physical Exam: Constitutional: Moderately built and nourished.       Vitals:    02/10/21 0015 02/10/21 0109  02/10/21 0145 02/10/21 0230  BP: 102/67 (!) 113/53 (!) 107/59 (!) 102/52  Pulse: 88 72 76 70  Resp: (!) 26 (!) 26 (!) 28 16  Temp:          TempSrc:          SpO2: 95% 96% 92% 91%  Weight:          Height:            Eyes: Anicteric no pallor. ENMT: No discharge from the ears eyes nose and mouth. Neck: No mass felt.  No neck rigidity. Respiratory: No rhonchi or crepitations. Cardiovascular: S1-S2 heard. Abdomen: Soft nontender bowel sound present. Musculoskeletal: Pain on moving right hip.  No significant shortening or external rotation of the RLE Skin: No rash. Neurologic: Alert awake oriented time place and person.  Moving all extremities. Psychiatric: Appears normal.  Normal affect.  Assessment/Plan: Right hip impacted femoral neck fracture L1 Compression fracture  Plan: NPO for operative management of right hip Treatment of right hip to be determined after further discussion with her son  Upon review of options to treat her right hip fracture we have elected to proceed with closed reduction and placement of cannulated screws  I have asked Dr Shon Baton to take a look at the L1 compressin fracture by CT scan to provide recommendations.  Depending on his assessment further recommendations will be provided. Pain control for now  Shelda Pal 02/10/2021, 7:43 AM

## 2021-02-10 NOTE — ED Notes (Signed)
Pt to OR, VSS, family at [facility], denies questions or needs

## 2021-02-10 NOTE — H&P (View-Only) (Signed)
Reason for Consult: 1. Right hip fracture, 2. L1 compression fracture Referring Physician: Sunnie Nielsen, MD  Carrie Martinez is an 70 y.o. female.  HPI: Carrie Martinez is a 70 y.o. female with history of multiple sclerosis uses a walker to ambulate had a fall when patient was trying to reach a smoke detector with a stick and lost balance and fell onto her right side hitting her stove and falling to the ground.  Denies losing consciousness.  Back hurting more than the right hip    ED Course: In the ER x-rays and CT scans were done which shows right hip fracture and L1 compression fracture.  Labs show WBC count of 15.9 patient afebrile COVID test negative.  Lives alone Son lives in [place] Dependent on walker for mobilization    Past Medical History:  Diagnosis Date   Multiple sclerosis [facility])     Past Surgical History:  Procedure Laterality Date   TUBAL LIGATION      Family History  Problem Relation Age of Onset   Diabetes type II Mother    CAD Mother    CAD Father     Social History:  reports that she has been smoking cigarettes. She has never used smokeless tobacco. She reports that she does not drink alcohol and does not use drugs.  Allergies: No Known Allergies  Medications: I have reviewed the patient's current medications. Scheduled:  Results for orders placed or performed during the hospital encounter of 02/09/21 (from the past 24 hour(s))  CBC with Differential     Status: Abnormal   Collection Time: 02/09/21  9:23 PM  Result Value Ref Range   WBC 15.9 (H) 4.0 - 10.5 K/uL   RBC 4.18 3.87 - 5.11 MIL/uL   Hemoglobin 14.2 12.0 - 15.0 g/dL   HCT 22.0 25.4 - 27.0 %   MCV 100.2 (H) 80.0 - 100.0 fL   MCH 34.0 26.0 - 34.0 pg   MCHC 33.9 30.0 - 36.0 g/dL   RDW 62.3 76.2 - 83.1 %   Platelets 199 150 - 400 K/uL   nRBC 0.0 0.0 - 0.2 %   Neutrophils Relative % 84 %   Neutro Abs 13.3 (H) 1.7 - 7.7 K/uL   Lymphocytes Relative 7 %   Lymphs Abs 1.1 0.7 - 4.0 K/uL    Monocytes Relative 8 %   Monocytes Absolute 1.3 (H) 0.1 - 1.0 K/uL   Eosinophils Relative 0 %   Eosinophils Absolute 0.0 0.0 - 0.5 K/uL   Basophils Relative 0 %   Basophils Absolute 0.1 0.0 - 0.1 K/uL   Immature Granulocytes 1 %   Abs Immature Granulocytes 0.10 (H) 0.00 - 0.07 K/uL  Basic metabolic panel     Status: Abnormal   Collection Time: 02/09/21  9:23 PM  Result Value Ref Range   Sodium 138 135 - 145 mmol/L   Potassium 3.6 3.5 - 5.1 mmol/L   Chloride 103 98 - 111 mmol/L   CO2 22 22 - 32 mmol/L   Glucose, Bld 110 (H) 70 - 99 mg/dL   BUN 11 8 - 23 mg/dL   Creatinine, Ser 5.17 0.44 - 1.00 mg/dL   Calcium 9.1 8.9 - 61.6 mg/dL   GFR, Estimated >07 >37 mL/min   Anion gap 13 5 - 15  Resp Panel by RT-PCR (Flu A&B, Covid) Nasopharyngeal Swab     Status: None   Collection Time: 02/10/21 12:03 AM   Specimen: Nasopharyngeal Swab; Nasopharyngeal(NP) swabs in vial transport medium  Result Value Ref Range   SARS Coronavirus 2 by RT PCR NEGATIVE NEGATIVE   Influenza A by PCR NEGATIVE NEGATIVE   Influenza B by PCR NEGATIVE NEGATIVE  CBC     Status: None   Collection Time: 02/10/21  4:57 AM  Result Value Ref Range   WBC 9.9 4.0 - 10.5 K/uL   RBC 3.99 3.87 - 5.11 MIL/uL   Hemoglobin 13.5 12.0 - 15.0 g/dL   HCT 71.6 96.7 - 89.3 %   MCV 99.7 80.0 - 100.0 fL   MCH 33.8 26.0 - 34.0 pg   MCHC 33.9 30.0 - 36.0 g/dL   RDW 81.0 17.5 - 10.2 %   Platelets 182 150 - 400 K/uL   nRBC 0.0 0.0 - 0.2 %  Basic metabolic panel     Status: Abnormal   Collection Time: 02/10/21  4:57 AM  Result Value Ref Range   Sodium 137 135 - 145 mmol/L   Potassium 3.5 3.5 - 5.1 mmol/L   Chloride 104 98 - 111 mmol/L   CO2 25 22 - 32 mmol/L   Glucose, Bld 120 (H) 70 - 99 mg/dL   BUN 8 8 - 23 mg/dL   Creatinine, Ser 5.85 0.44 - 1.00 mg/dL   Calcium 8.9 8.9 - 27.7 mg/dL   GFR, Estimated >82 >42 mL/min   Anion gap 8 5 - 15     X-ray: CLINICAL DATA:  Fall, right hip pain   EXAM: CT OF THE RIGHT HIP WITHOUT  CONTRAST   TECHNIQUE: Multidetector CT imaging of the right hip was performed according to the standard protocol. Multiplanar CT image reconstructions were also generated.   COMPARISON:  Right femur radiographs dated 02/09/2021   FINDINGS: Subcapital right hip fracture, impacted, with foreshortening. Mild subcutaneous stranding overlying the greater trochanter (series 5/image 64). No discrete hematoma.   Visualized bony pelvis is intact. Right hip joint space is preserved.   Soft tissue pelvis is unremarkable.  No pelvic ascites.   IMPRESSION: Impacted subcapital right hip fracture, as above.     Electronically Signed   By: Charline Bills M.D.  CLINICAL DATA:  Fall, low back pain   EXAM: CT LUMBAR SPINE WITHOUT CONTRAST   TECHNIQUE: Multidetector CT imaging of the lumbar spine was performed without intravenous contrast administration. Multiplanar CT image reconstructions were also generated.   COMPARISON:  None.   FINDINGS: Segmentation: 5 lumbar-type vertebral bodies.   Alignment: Normal lumbar lordosis.   Vertebrae: Mild to moderate superior endplate compression fracture deformity at L1, with approximately 40% loss of height (sagittal image 37), and minimal retropulsion (sagittal image 35).   Vertebral body heights are otherwise maintained. Inferior endplate Schmorl's node deformity at L5 (sagittal image 38).   Paraspinal and other soft tissues: Mild atherosclerotic calcifications of the aortic arch and iliac arteries. Otherwise unremarkable.   Disc levels: Mild degenerative changes at L2-3 and L5-S1.   IMPRESSION: Mild to moderate superior endplate compression fracture deformity at L1, with approximately 40% loss of height, and minimal retropulsion.     Electronically Signed   By: Charline Bills M.D.  ROS: As per HPI History of MS noting that her Neurologist raised concerns of progression  Blood pressure (!) 110/52, pulse 73, temperature  98.6 F (37 C), temperature source Oral, resp. rate (!) 21, height 5\' 5"  (1.651 m), weight 54.4 kg, SpO2 91 %.  Physical Exam: Constitutional: Moderately built and nourished.       Vitals:    02/10/21 0015 02/10/21 0109  02/10/21 0145 02/10/21 0230  BP: 102/67 (!) 113/53 (!) 107/59 (!) 102/52  Pulse: 88 72 76 70  Resp: (!) 26 (!) 26 (!) 28 16  Temp:          TempSrc:          SpO2: 95% 96% 92% 91%  Weight:          Height:            Eyes: Anicteric no pallor. ENMT: No discharge from the ears eyes nose and mouth. Neck: No mass felt.  No neck rigidity. Respiratory: No rhonchi or crepitations. Cardiovascular: S1-S2 heard. Abdomen: Soft nontender bowel sound present. Musculoskeletal: Pain on moving right hip.  No significant shortening or external rotation of the RLE Skin: No rash. Neurologic: Alert awake oriented time place and person.  Moving all extremities. Psychiatric: Appears normal.  Normal affect.  Assessment/Plan: Right hip impacted femoral neck fracture L1 Compression fracture  Plan: NPO for operative management of right hip Treatment of right hip to be determined after further discussion with her son  Upon review of options to treat her right hip fracture we have elected to proceed with closed reduction and placement of cannulated screws  I have asked Dr Shon Baton to take a look at the L1 compressin fracture by CT scan to provide recommendations.  Depending on his assessment further recommendations will be provided. Pain control for now  Shelda Pal 02/10/2021, 7:43 AM

## 2021-02-10 NOTE — ED Provider Notes (Signed)
[facility] EMERGENCY DEPARTMENT Provider Note   CSN: [id number] Arrival date & time: 02/09/21  2106     History Chief Complaint  Patient presents with   Fall   Back Pain    Carrie Martinez is a 70 y.o. female.  Patient presents to the ED with a chief complaint of fall and right hip pain and low back pain.  She has hx of MS.  States that she generally walks with a cane.  States that she was reaching overhead to adjust a smoke alarm and fell backward and sideways landing on her right hip.  She states that now she can't move her right leg.  She complains of sever 10/10 pain.  It is worsened with movement and palpation.  She denies any chest pain or SOB.  The history is provided by the patient. No language interpreter was used.      History reviewed. No pertinent past medical history.  There are no problems to display for this patient.   History reviewed. No pertinent surgical history.   OB History   No obstetric history on file.     No family history on file.     Home Medications Prior to Admission medications   Not on File    Allergies    Patient has no known allergies.  Review of Systems   Review of Systems  All other systems reviewed and are negative.  Physical Exam Updated Vital Signs BP 121/62   Pulse 99   Temp 97.9 F (36.6 C) (Oral)   Resp (!) 29   Ht 5\' 5"  (1.651 m)   Wt 54.4 kg   SpO2 94%   BMI 19.97 kg/m   Physical Exam Vitals and nursing note reviewed.  Constitutional:      General: She is not in acute distress.    Appearance: She is well-developed.  HENT:     Head: Normocephalic and atraumatic.  Eyes:     Conjunctiva/sclera: Conjunctivae normal.  Cardiovascular:     Rate and Rhythm: Normal rate and regular rhythm.     Pulses: Normal pulses.     Heart sounds: No murmur heard. Pulmonary:     Effort: Pulmonary effort is normal. No respiratory distress.     Breath sounds: Normal breath sounds.  Abdominal:      Palpations: Abdomen is soft.     Tenderness: There is no abdominal tenderness.  Musculoskeletal:     Cervical back: Neck supple.     Comments: Shortening of right leg No open fracture  Skin:    General: Skin is warm and dry.  Neurological:     Mental Status: She is alert.    ED Results / Procedures / Treatments   Labs (all labs ordered are listed, but only abnormal results are displayed) Labs Reviewed  CBC WITH DIFFERENTIAL/PLATELET - Abnormal; Notable for the following components:      Result Value   WBC 15.9 (*)    MCV 100.2 (*)    Neutro Abs 13.3 (*)    Monocytes Absolute 1.3 (*)    Abs Immature Granulocytes 0.10 (*)    All other components within normal limits  BASIC METABOLIC PANEL - Abnormal; Notable for the following components:   Glucose, Bld 110 (*)    All other components within normal limits  RESP PANEL BY RT-PCR (FLU A&B, COVID) [id number]    EKG None  Radiology DG Thoracic Spine 2 View  Result Date: 02/09/2021 CLINICAL DATA:  Status post fall.  EXAM: THORACIC SPINE 2 VIEWS COMPARISON:  November 16, 2019 FINDINGS: There is no evidence of an acute thoracic spine fracture. Stable loss of vertebral body height is seen at the approximate level of T11. An ill-defined deformity is seen involving the superior endplate of the L1 vertebral body on the frontal view. This is not clearly visualized on the prior study. Alignment is normal. No other significant bone abnormalities are identified. IMPRESSION: Ill-defined deformity involving the superior endplate of the L1 vertebral body which may be chronic in nature. A mild acute fracture cannot be excluded. CT correlation is recommended. Electronically Signed   By: Aram Candela M.D.   On: 02/09/2021 22:21   DG Lumbar Spine Complete  Result Date: 02/09/2021 CLINICAL DATA:  Status post fall. EXAM: LUMBAR SPINE - COMPLETE 4+ VIEW COMPARISON:  November 16, 2019 FINDINGS: An acute compression fracture deformity is seen involving the L1  vertebral body. Mild levoscoliosis is noted. Mild multilevel endplate sclerosis is present. Intervertebral disc spaces are maintained. IMPRESSION: 1. Acute compression fracture of the L1 vertebral body. MRI correlation is recommended. Electronically Signed   By: Aram Candela M.D.   On: 02/09/2021 22:24   DG Pelvis 1-2 Views  Result Date: 02/09/2021 CLINICAL DATA:  Status post fall. EXAM: PELVIS - 1-2 VIEW COMPARISON:  None. FINDINGS: Evaluation of the right femur is limited secondary to patient positioning. There is no evidence of pelvic fracture or diastasis. Degenerative changes are seen involving both hips in the form of joint space narrowing and acetabular sclerosis. No pelvic bone lesions are seen. IMPRESSION: 1. No acute findings. Electronically Signed   By: Aram Candela M.D.   On: 02/09/2021 22:18   DG Femur Min 2 Views Right  Result Date: 02/09/2021 CLINICAL DATA:  Status post fall. EXAM: RIGHT FEMUR 2 VIEWS COMPARISON:  None. FINDINGS: An impacted fracture deformity of indeterminate age is seen involving the junction of the superior aspect of the right femoral head and neck. There is no evidence of dislocation. Degenerative changes are seen in the form of joint space narrowing and acetabular sclerosis. Soft tissues are unremarkable. IMPRESSION: 1. Impacted fracture deformity of the right femoral head and neck. CT correlation is recommended. Electronically Signed   By: Aram Candela M.D.   On: 02/09/2021 22:22    Procedures Procedures   Medications Ordered in ED Medications  fentaNYL (SUBLIMAZE) injection 50 mcg (50 mcg Intravenous Given 02/10/21 0000)    ED Course  I have reviewed the triage vital signs and the nursing notes.  Pertinent labs & imaging results that were available during my care of the patient were reviewed by me and considered in my medical decision making (see chart for details).    MDM Rules/Calculators/A&P                           Patient here  with a mechanical fall from earlier in the evening.  She has history of MS.  Normally walks with a walker or a cane.  Was reaching overhead and fell backward landing on her right hip.  She complains mainly of right hip pain.  Plain films show right femoral head neck fracture.  There is also an L1 fracture.  Case discussed with Dr. Charlann Boxer, from orthopedic surgery, who will consult on the patient.  Anticipate surgery tomorrow sometime.  Appreciate Dr. Toniann Fail for admitting the patient. Final Clinical Impression(s) / ED Diagnoses Final diagnoses:  Closed fracture of neck of right femur,  initial encounter [facility])  Fall, initial encounter  Closed fracture of first lumbar vertebra, unspecified fracture morphology, initial encounter [facility])    Rx / DC Orders ED Discharge Orders     None        Roxy Horseman, PA-C 02/10/21 2633    Nira Conn, MD 02/10/21 [phone]

## 2021-02-10 NOTE — ED Notes (Signed)
Pt returned from CT °

## 2021-02-10 NOTE — Discharge Instructions (Signed)

## 2021-02-10 NOTE — Transfer of Care (Signed)
Immediate Anesthesia Transfer of Care Note  Patient: Consuello Bossier  Procedure(s) Performed: PERCUTANEOUS PINNING (Right: Hip)  Patient Location: PACU  Anesthesia Type:General  Level of Consciousness: awake, alert  and oriented  Airway & Oxygen Therapy: Patient Spontanous Breathing and Patient connected to nasal cannula oxygen  Post-op Assessment: Report given to RN and Post -op Vital signs reviewed and stable  Post vital signs: Reviewed and stable  Last Vitals:  Vitals Value Taken Time  BP 88/56 02/10/21 2013  Temp    Pulse 103 02/10/21 2019  Resp 17 02/10/21 2019  SpO2 94 % 02/10/21 2019  Vitals shown include unvalidated device data.  Last Pain:  Vitals:   02/10/21 1715  TempSrc: Oral  PainSc: 2          Complications: No notable events documented.

## 2021-02-10 NOTE — H&P (Signed)
History and Physical    Carrie BossierCarolyn H Hammitt NWG:[id number] DOB: April 03, 1951 DOA: 02/09/2021  PCP: Marlyn CorporalYates, Kate H, PA  Patient coming from: Home.  Chief Complaint: Fall.  HPI: Carrie Martinez is a 70 y.o. female with history of multiple sclerosis uses a walker to ambulate had a fall when patient was trying to reach and lost balance and fell onto her right side.  Denies losing consciousness.  ED Course: In the ER x-rays and CT scans were done which shows right hip fracture and L1 compression fracture.  On-call orthopedic surgeon Dr. Effie Shyoleman was consulted.  Patient admitted for further work-up.  Labs show WBC count of 15.9 patient afebrile COVID test negative.  Review of Systems: As per HPI, rest all negative.   Past Medical History:  Diagnosis Date   Multiple sclerosis [facility](HCC)     Past Surgical History:  Procedure Laterality Date   TUBAL LIGATION       reports that she has been smoking cigarettes. She has never used smokeless tobacco. She reports that she does not drink alcohol and does not use drugs.  No Known Allergies  Family History  Problem Relation Age of Onset   Diabetes type II Mother    CAD Mother    CAD Father     Prior to Admission medications   Not on File    Physical Exam: Constitutional: Moderately built and nourished. Vitals:   02/10/21 0015 02/10/21 0109 02/10/21 0145 02/10/21 0230  BP: 102/67 (!) 113/53 (!) 107/59 (!) 102/52  Pulse: 88 72 76 70  Resp: (!) 26 (!) 26 (!) 28 16  Temp:      TempSrc:      SpO2: 95% 96% 92% 91%  Weight:      Height:       Eyes: Anicteric no pallor. ENMT: No discharge from the ears eyes nose and mouth. Neck: No mass felt.  No neck rigidity. Respiratory: No rhonchi or crepitations. Cardiovascular: S1-S2 heard. Abdomen: Soft nontender bowel sound present. Musculoskeletal: Pain on moving right hip. Skin: No rash. Neurologic: Alert awake oriented time place and person.  Moving all extremities. Psychiatric: Appears normal.   Normal affect.   Labs on Admission: I have personally reviewed following labs and imaging studies  CBC: Recent Labs  Lab 02/09/21 2123  WBC 15.9*  NEUTROABS 13.3*  HGB 14.2  HCT 41.9  MCV 100.2*  PLT 199   Basic Metabolic Panel: Recent Labs  Lab 02/09/21 2123  NA 138  K 3.6  CL 103  CO2 22  GLUCOSE 110*  BUN 11  CREATININE 0.56  CALCIUM 9.1   GFR: Estimated Creatinine Clearance: 57 mL/min (by C-G formula based on SCr of 0.56 mg/dL). Liver Function Tests: No results for input(s): AST, ALT, ALKPHOS, BILITOT, PROT, ALBUMIN in the last 168 hours. No results for input(s): LIPASE, AMYLASE in the last 168 hours. No results for input(s): AMMONIA in the last 168 hours. Coagulation Profile: No results for input(s): INR, PROTIME in the last 168 hours. Cardiac Enzymes: No results for input(s): CKTOTAL, CKMB, CKMBINDEX, TROPONINI in the last 168 hours. BNP (last 3 results) No results for input(s): PROBNP in the last 8760 hours. HbA1C: No results for input(s): HGBA1C in the last 72 hours. CBG: No results for input(s): GLUCAP in the last 168 hours. Lipid Profile: No results for input(s): CHOL, HDL, LDLCALC, TRIG, CHOLHDL, LDLDIRECT in the last 72 hours. Thyroid Function Tests: No results for input(s): TSH, T4TOTAL, FREET4, T3FREE, THYROIDAB in the last 72 hours.  Anemia Panel: No results for input(s): VITAMINB12, FOLATE, FERRITIN, TIBC, IRON, RETICCTPCT in the last 72 hours. Urine analysis: No results found for: COLORURINE, APPEARANCEUR, LABSPEC, PHURINE, GLUCOSEU, HGBUR, BILIRUBINUR, KETONESUR, PROTEINUR, UROBILINOGEN, NITRITE, LEUKOCYTESUR Sepsis Labs: @LABRCNTIP (procalcitonin:4,lacticidven:4) ) Recent Results (from the past 240 hour(s))  Resp Panel by RT-PCR (Flu A&B, Covid) Nasopharyngeal Swab     Status: None   Collection Time: 02/10/21 12:03 AM   Specimen: Nasopharyngeal Swab; Nasopharyngeal(NP) swabs in vial transport medium  Result Value Ref Range Status   SARS  Coronavirus 2 by RT PCR NEGATIVE NEGATIVE Final    Comment: (NOTE) SARS-CoV-2 target nucleic acids are NOT DETECTED.  The SARS-CoV-2 RNA is generally detectable in upper respiratory specimens during the acute phase of infection. The lowest concentration of SARS-CoV-2 viral copies this assay can detect is 138 copies/mL. A negative result does not preclude SARS-Cov-2 infection and should not be used as the sole basis for treatment or other patient management decisions. A negative result may occur with  improper specimen collection/handling, submission of specimen other than nasopharyngeal swab, presence of viral mutation(s) within the areas targeted by this assay, and inadequate number of viral copies(<138 copies/mL). A negative result must be combined with clinical observations, patient history, and epidemiological information. The expected result is Negative.  Fact Sheet for Patients:  02/12/21  Fact Sheet for Healthcare Providers:  BloggerCourse.com  This test is no t yet approved or cleared by the [employer] FDA and  has been authorized for detection and/or diagnosis of SARS-CoV-2 by FDA under an Emergency Use Authorization (EUA). This EUA will remain  in effect (meaning this test can be used) for the duration of the COVID-19 declaration under Section 564(b)(1) of the Act, 21 U.S.C.section 360bbb-3(b)(1), unless the authorization is terminated  or revoked sooner.       Influenza A by PCR NEGATIVE NEGATIVE Final   Influenza B by PCR NEGATIVE NEGATIVE Final    Comment: (NOTE) The Xpert Xpress SARS-CoV-2/FLU/RSV plus assay is intended as an aid in the diagnosis of influenza from Nasopharyngeal swab specimens and should not be used as a sole basis for treatment. Nasal washings and aspirates are unacceptable for Xpert Xpress SARS-CoV-2/FLU/RSV testing.  Fact Sheet for  Patients: [place]  Fact Sheet for Healthcare Providers: BloggerCourse.com  This test is not yet approved or cleared by the [employer] FDA and has been authorized for detection and/or diagnosis of SARS-CoV-2 by FDA under an Emergency Use Authorization (EUA). This EUA will remain in effect (meaning this test can be used) for the duration of the COVID-19 declaration under Section 564(b)(1) of the Act, 21 U.S.C. section 360bbb-3(b)(1), unless the authorization is terminated or revoked.  Performed at [facility], [address], [place], [place]      Radiological Exams on Admission: DG Thoracic Spine 2 View  Result Date: 02/09/2021 CLINICAL DATA:  Status post fall. EXAM: THORACIC SPINE 2 VIEWS COMPARISON:  November 16, 2019 FINDINGS: There is no evidence of an acute thoracic spine fracture. Stable loss of vertebral body height is seen at the approximate level of T11. An ill-defined deformity is seen involving the superior endplate of the L1 vertebral body on the frontal view. This is not clearly visualized on the prior study. Alignment is normal. No other significant bone abnormalities are identified. IMPRESSION: Ill-defined deformity involving the superior endplate of the L1 vertebral body which may be chronic in nature. A mild acute fracture cannot be excluded. CT correlation is recommended. Electronically Signed  By: Aram Candela M.D.   On: 02/09/2021 22:21   DG Lumbar Spine Complete  Result Date: 02/09/2021 CLINICAL DATA:  Status post fall. EXAM: LUMBAR SPINE - COMPLETE 4+ VIEW COMPARISON:  November 16, 2019 FINDINGS: An acute compression fracture deformity is seen involving the L1 vertebral body. Mild levoscoliosis is noted. Mild multilevel endplate sclerosis is present. Intervertebral disc spaces are maintained. IMPRESSION: 1. Acute compression fracture of the L1 vertebral body. MRI correlation is recommended.  Electronically Signed   By: Aram Candela M.D.   On: 02/09/2021 22:24   DG Pelvis 1-2 Views  Result Date: 02/09/2021 CLINICAL DATA:  Status post fall. EXAM: PELVIS - 1-2 VIEW COMPARISON:  None. FINDINGS: Evaluation of the right femur is limited secondary to patient positioning. There is no evidence of pelvic fracture or diastasis. Degenerative changes are seen involving both hips in the form of joint space narrowing and acetabular sclerosis. No pelvic bone lesions are seen. IMPRESSION: 1. No acute findings. Electronically Signed   By: Aram Candela M.D.   On: 02/09/2021 22:18   CT HEAD WO CONTRAST ( )  Result Date: 02/10/2021 CLINICAL DATA:  Fall from standing EXAM: CT HEAD WITHOUT CONTRAST CT CERVICAL SPINE WITHOUT CONTRAST TECHNIQUE: Multidetector CT imaging of the head and cervical spine was performed following the standard protocol without intravenous contrast. Multiplanar CT image reconstructions of the cervical spine were also generated. COMPARISON:  None. FINDINGS: CT HEAD FINDINGS Brain: No evidence of acute infarction, hemorrhage, hydrocephalus, extra-axial collection or mass lesion/mass effect. Mild degenerative changes of the visualized thoracolumbar spine. Vascular: No hyperdense vessel or unexpected calcification. Skull: Normal. Negative for fracture or focal lesion. Sinuses/Orbits: The visualized paranasal sinuses are essentially clear. The mastoid air cells are unopacified. Other: None. CT CERVICAL SPINE FINDINGS Alignment: Normal cervical lordosis. Skull base and vertebrae: No acute fracture. No primary bone lesion or focal pathologic process. Soft tissues and spinal canal: No prevertebral fluid or swelling. No visible canal hematoma. Disc levels: Mild degenerative changes of the mid cervical spine. Spinal canal is patent. Upper chest: Visualized lung apices are notable for biapical pleural-parenchymal scarring and mild centrilobular and paraseptal emphysematous changes. Other:  Visualized thyroid is notable for a 14 mm right thyroid nodule. Not clinically significant; no follow-up imaging recommended (ref: J Am Coll Radiol. 2015 Feb;12(2): 143-50). IMPRESSION: No evidence of acute intracranial abnormality. Mild small vessel ischemic changes. No evidence of traumatic injury to the cervical spine. Mild degenerative changes. Electronically Signed   By: Charline Bills M.D.   On: 02/10/2021 01:33   CT Cervical Spine Wo Contrast  Result Date: 02/10/2021 CLINICAL DATA:  Fall from standing EXAM: CT HEAD WITHOUT CONTRAST CT CERVICAL SPINE WITHOUT CONTRAST TECHNIQUE: Multidetector CT imaging of the head and cervical spine was performed following the standard protocol without intravenous contrast. Multiplanar CT image reconstructions of the cervical spine were also generated. COMPARISON:  None. FINDINGS: CT HEAD FINDINGS Brain: No evidence of acute infarction, hemorrhage, hydrocephalus, extra-axial collection or mass lesion/mass effect. Mild degenerative changes of the visualized thoracolumbar spine. Vascular: No hyperdense vessel or unexpected calcification. Skull: Normal. Negative for fracture or focal lesion. Sinuses/Orbits: The visualized paranasal sinuses are essentially clear. The mastoid air cells are unopacified. Other: None. CT CERVICAL SPINE FINDINGS Alignment: Normal cervical lordosis. Skull base and vertebrae: No acute fracture. No primary bone lesion or focal pathologic process. Soft tissues and spinal canal: No prevertebral fluid or swelling. No visible canal hematoma. Disc levels: Mild degenerative changes of the mid cervical spine. Spinal canal  is patent. Upper chest: Visualized lung apices are notable for biapical pleural-parenchymal scarring and mild centrilobular and paraseptal emphysematous changes. Other: Visualized thyroid is notable for a 14 mm right thyroid nodule. Not clinically significant; no follow-up imaging recommended (ref: J Am Coll Radiol. 2015 Feb;12(2):  143-50). IMPRESSION: No evidence of acute intracranial abnormality. Mild small vessel ischemic changes. No evidence of traumatic injury to the cervical spine. Mild degenerative changes. Electronically Signed   By: Charline Bills M.D.   On: 02/10/2021 01:33   CT Lumbar Spine Wo Contrast  Result Date: 02/10/2021 CLINICAL DATA:  Fall, low back pain EXAM: CT LUMBAR SPINE WITHOUT CONTRAST TECHNIQUE: Multidetector CT imaging of the lumbar spine was performed without intravenous contrast administration. Multiplanar CT image reconstructions were also generated. COMPARISON:  None. FINDINGS: Segmentation: 5 lumbar-type vertebral bodies. Alignment: Normal lumbar lordosis. Vertebrae: Mild to moderate superior endplate compression fracture deformity at L1, with approximately 40% loss of height (sagittal image 37), and minimal retropulsion (sagittal image 35). Vertebral body heights are otherwise maintained. Inferior endplate Schmorl's node deformity at L5 (sagittal image 38). Paraspinal and other soft tissues: Mild atherosclerotic calcifications of the aortic arch and iliac arteries. Otherwise unremarkable. Disc levels: Mild degenerative changes at L2-3 and L5-S1. IMPRESSION: Mild to moderate superior endplate compression fracture deformity at L1, with approximately 40% loss of height, and minimal retropulsion. Electronically Signed   By: Charline Bills M.D.   On: 02/10/2021 01:36   CT Hip Right Wo Contrast  Result Date: 02/10/2021 CLINICAL DATA:  Fall, right hip pain EXAM: CT OF THE RIGHT HIP WITHOUT CONTRAST TECHNIQUE: Multidetector CT imaging of the right hip was performed according to the standard protocol. Multiplanar CT image reconstructions were also generated. COMPARISON:  Right femur radiographs dated 02/09/2021 FINDINGS: Subcapital right hip fracture, impacted, with foreshortening. Mild subcutaneous stranding overlying the greater trochanter (series 5/image 64). No discrete hematoma. Visualized bony  pelvis is intact. Right hip joint space is preserved. Soft tissue pelvis is unremarkable.  No pelvic ascites. IMPRESSION: Impacted subcapital right hip fracture, as above. Electronically Signed   By: Charline Bills M.D.   On: 02/10/2021 01:41   DG Femur Min 2 Views Right  Result Date: 02/09/2021 CLINICAL DATA:  Status post fall. EXAM: RIGHT FEMUR 2 VIEWS COMPARISON:  None. FINDINGS: An impacted fracture deformity of indeterminate age is seen involving the junction of the superior aspect of the right femoral head and neck. There is no evidence of dislocation. Degenerative changes are seen in the form of joint space narrowing and acetabular sclerosis. Soft tissues are unremarkable. IMPRESSION: 1. Impacted fracture deformity of the right femoral head and neck. CT correlation is recommended. Electronically Signed   By: Aram Candela M.D.   On: 02/09/2021 22:22      Assessment/Plan Active Problems:   Closed right hip fracture, initial encounter (HCC)   Closed compression fracture of body of L1 vertebra (HCC)   Multiple sclerosis (HCC)   Hip fracture (HCC)    Right hip fracture status post fall -Dr. Constance Goltz orthopedic surgeon has been consulted we will keep patient n.p.o. in anticipation of surgery.  Pain relief medications. L1 compression fracture will follow orthopedic surgery recommendations. Multiple sclerosis -continue home medications.  Home medication needs to be verified. Leukocytosis could be reactionary.  Since patient has right hip fracture L1 compression fracture will need close monitoring and further work-up and inpatient status.   DVT prophylaxis: SCDs.  Avoiding anticoagulation in anticipation of surgery. Code Status: Full code. Family Communication: Discussed with  patient. Disposition Plan: May need rehab. Consults called: Orthopedics. Admission status: Inpatient.   Eduard Clos MD [facility] Pager [phone].  If 7PM-7AM, please contact  night-coverage [email or web address] Password [facility]  02/10/2021, 4:26 AM

## 2021-02-10 NOTE — Consult Note (Signed)
Cardiology Consultation:   Patient ID: Carrie Martinez MRN: [id number]; DOB: May 05, 1951  Admit date: 02/09/2021 Date of Consult: 02/10/2021  PCP:  Carrie Corporal, PA   [facility] Providers Cardiologist:  New  Patient Profile:   Carrie Martinez is a 70 y.o. female with a hx of multiple sclerosis and current tobacco smoker who is being seen 02/10/2021 for the evaluation of surgical clearance at the request of Dr. Sunnie Nielsen.  History of Present Illness:   [name] uses walker for ambulation at baseline.  The patient was trying to reach smoke detector with stick and lost balance.  Fall to the right side hitting stove and falling into ground.  No loss of consciousness.  Work-up elevated right hip and L1 compression fracture.  CT of head and cervical spine without acute abnormality.  Hemoglobin 13.5 Creatinine and electrolytes are normal  Cardiology is asked for surgical clearance.  At baseline, patient has balance issue.  She lives by herself.  Uses walker for ambulation.  Denies prior cardiac history.  No chest pain, shortness of breath, dizziness, palpitation, orthopnea, PND, syncope or melena.  Intermittent ankle swelling.  Denies alcohol abuse.  She smokes 1 pack of cigarette each day for past 20 years.  Father and mom had MI in their 63s.  Past Medical History:  Diagnosis Date   Multiple sclerosis (HCC)     Past Surgical History:  Procedure Laterality Date   TUBAL LIGATION      Inpatient Medications: Scheduled Meds:  Continuous Infusions:  lactated ringers     PRN Meds: morphine injection  Allergies:   No Known Allergies  Social History:   Social History   Socioeconomic History   Marital status: Widowed    Spouse name: Not on file   Number of children: Not on file   Years of education: Not on file   Highest education level: Not on file  Occupational History   Not on file  Tobacco Use   Smoking status: Every Day    Types: Cigarettes   Smokeless  tobacco: Never  Substance and Sexual Activity   Alcohol use: Never   Drug use: Never   Sexual activity: Not on file  Other Topics Concern   Not on file  Social History Narrative   Not on file   Social Determinants of Health   Financial Resource Strain: Not on file  Food Insecurity: Not on file  Transportation Needs: Not on file  Physical Activity: Not on file  Stress: Not on file  Social Connections: Not on file  Intimate Partner Violence: Not on file    Family History:   Family History  Problem Relation Age of Onset   Diabetes type II Mother    CAD Mother    CAD Father      ROS:  Please see the history of present illness.  All other ROS reviewed and negative.     Physical Exam/Data:   Vitals:   02/10/21 0450 02/10/21 0645 02/10/21 0745 02/10/21 0830  BP:  (!) 110/52 (!) 102/51 (!) 109/45  Pulse:  73 77 81  Resp:  (!) 21 (!) 26 (!) 26  Temp: 98.6 F (37 C)     TempSrc: Oral     SpO2:  91% 92% 93%  Weight:      Height:       No intake or output data in the 24 hours ending 02/10/21 0950 Last 3 Weights 02/09/2021  Weight (lbs) 120 lb  Weight (kg) 54.432  kg     Body mass index is 19.97 kg/m.  General: Thin frail female in no acute distress HEENT: normal Lymph: no adenopathy Neck: no JVD Endocrine:  No thryomegaly Vascular: No carotid bruits; FA pulses 2+ bilaterally without bruits  Cardiac:  normal S1, S2; RRR; no murmur  Lungs:  clear to auscultation bilaterally, no wheezing, rhonchi or rales  Abd: soft, nontender, no hepatomegaly  Ext: no edema Musculoskeletal:  No deformities, BUE and BLE strength normal and equal Skin: warm and dry  Neuro:  CNs 2-12 intact, no focal abnormalities noted Psych:  Normal affect   EKG:  The EKG was personally reviewed and demonstrates: Sinus rhythm, EKG reading as minimal ST elevation in inferior lead Telemetry:  Telemetry was personally reviewed and demonstrates: Sinus rhythm at rate of 70 to 80s  Relevant CV  Studies:  N/A  Laboratory Data:  Chemistry Recent Labs  Lab 02/09/21 2123 02/10/21 0457  NA 138 137  K 3.6 3.5  CL 103 104  CO2 22 25  GLUCOSE 110* 120*  BUN 11 8  CREATININE 0.56 0.48  CALCIUM 9.1 8.9  GFRNONAA >60 >60  ANIONGAP 13 8     Hematology Recent Labs  Lab 02/09/21 2123 02/10/21 0457  WBC 15.9* 9.9  RBC 4.18 3.99  HGB 14.2 13.5  HCT 41.9 39.8  MCV 100.2* 99.7  MCH 34.0 33.8  MCHC 33.9 33.9  RDW 13.3 13.0  PLT 199 182   Radiology/Studies:  DG Thoracic Spine 2 View  Result Date: 02/09/2021 CLINICAL DATA:  Status post fall. EXAM: THORACIC SPINE 2 VIEWS COMPARISON:  November 16, 2019 FINDINGS: There is no evidence of an acute thoracic spine fracture. Stable loss of vertebral body height is seen at the approximate level of T11. An ill-defined deformity is seen involving the superior endplate of the L1 vertebral body on the frontal view. This is not clearly visualized on the prior study. Alignment is normal. No other significant bone abnormalities are identified. IMPRESSION: Ill-defined deformity involving the superior endplate of the L1 vertebral body which may be chronic in nature. A mild acute fracture cannot be excluded. CT correlation is recommended. Electronically Signed   By: Aram Candela M.D.   On: 02/09/2021 22:21   DG Lumbar Spine Complete  Result Date: 02/09/2021 CLINICAL DATA:  Status post fall. EXAM: LUMBAR SPINE - COMPLETE 4+ VIEW COMPARISON:  November 16, 2019 FINDINGS: An acute compression fracture deformity is seen involving the L1 vertebral body. Mild levoscoliosis is noted. Mild multilevel endplate sclerosis is present. Intervertebral disc spaces are maintained. IMPRESSION: 1. Acute compression fracture of the L1 vertebral body. MRI correlation is recommended. Electronically Signed   By: Aram Candela M.D.   On: 02/09/2021 22:24   DG Pelvis 1-2 Views  Result Date: 02/09/2021 CLINICAL DATA:  Status post fall. EXAM: PELVIS - 1-2 VIEW COMPARISON:   None. FINDINGS: Evaluation of the right femur is limited secondary to patient positioning. There is no evidence of pelvic fracture or diastasis. Degenerative changes are seen involving both hips in the form of joint space narrowing and acetabular sclerosis. No pelvic bone lesions are seen. IMPRESSION: 1. No acute findings. Electronically Signed   By: Aram Candela M.D.   On: 02/09/2021 22:18   CT HEAD WO CONTRAST ( )  Result Date: 02/10/2021 CLINICAL DATA:  Fall from standing EXAM: CT HEAD WITHOUT CONTRAST CT CERVICAL SPINE WITHOUT CONTRAST TECHNIQUE: Multidetector CT imaging of the head and cervical spine was performed following the standard protocol without intravenous  contrast. Multiplanar CT image reconstructions of the cervical spine were also generated. COMPARISON:  None. FINDINGS: CT HEAD FINDINGS Brain: No evidence of acute infarction, hemorrhage, hydrocephalus, extra-axial collection or mass lesion/mass effect. Mild degenerative changes of the visualized thoracolumbar spine. Vascular: No hyperdense vessel or unexpected calcification. Skull: Normal. Negative for fracture or focal lesion. Sinuses/Orbits: The visualized paranasal sinuses are essentially clear. The mastoid air cells are unopacified. Other: None. CT CERVICAL SPINE FINDINGS Alignment: Normal cervical lordosis. Skull base and vertebrae: No acute fracture. No primary bone lesion or focal pathologic process. Soft tissues and spinal canal: No prevertebral fluid or swelling. No visible canal hematoma. Disc levels: Mild degenerative changes of the mid cervical spine. Spinal canal is patent. Upper chest: Visualized lung apices are notable for biapical pleural-parenchymal scarring and mild centrilobular and paraseptal emphysematous changes. Other: Visualized thyroid is notable for a 14 mm right thyroid nodule. Not clinically significant; no follow-up imaging recommended (ref: J Am Coll Radiol. 2015 Feb;12(2): 143-50). IMPRESSION: No evidence of  acute intracranial abnormality. Mild small vessel ischemic changes. No evidence of traumatic injury to the cervical spine. Mild degenerative changes. Electronically Signed   By: Charline Bills M.D.   On: 02/10/2021 01:33   CT Cervical Spine Wo Contrast  Result Date: 02/10/2021 CLINICAL DATA:  Fall from standing EXAM: CT HEAD WITHOUT CONTRAST CT CERVICAL SPINE WITHOUT CONTRAST TECHNIQUE: Multidetector CT imaging of the head and cervical spine was performed following the standard protocol without intravenous contrast. Multiplanar CT image reconstructions of the cervical spine were also generated. COMPARISON:  None. FINDINGS: CT HEAD FINDINGS Brain: No evidence of acute infarction, hemorrhage, hydrocephalus, extra-axial collection or mass lesion/mass effect. Mild degenerative changes of the visualized thoracolumbar spine. Vascular: No hyperdense vessel or unexpected calcification. Skull: Normal. Negative for fracture or focal lesion. Sinuses/Orbits: The visualized paranasal sinuses are essentially clear. The mastoid air cells are unopacified. Other: None. CT CERVICAL SPINE FINDINGS Alignment: Normal cervical lordosis. Skull base and vertebrae: No acute fracture. No primary bone lesion or focal pathologic process. Soft tissues and spinal canal: No prevertebral fluid or swelling. No visible canal hematoma. Disc levels: Mild degenerative changes of the mid cervical spine. Spinal canal is patent. Upper chest: Visualized lung apices are notable for biapical pleural-parenchymal scarring and mild centrilobular and paraseptal emphysematous changes. Other: Visualized thyroid is notable for a 14 mm right thyroid nodule. Not clinically significant; no follow-up imaging recommended (ref: J Am Coll Radiol. 2015 Feb;12(2): 143-50). IMPRESSION: No evidence of acute intracranial abnormality. Mild small vessel ischemic changes. No evidence of traumatic injury to the cervical spine. Mild degenerative changes. Electronically  Signed   By: Charline Bills M.D.   On: 02/10/2021 01:33   CT Lumbar Spine Wo Contrast  Result Date: 02/10/2021 CLINICAL DATA:  Fall, low back pain EXAM: CT LUMBAR SPINE WITHOUT CONTRAST TECHNIQUE: Multidetector CT imaging of the lumbar spine was performed without intravenous contrast administration. Multiplanar CT image reconstructions were also generated. COMPARISON:  None. FINDINGS: Segmentation: 5 lumbar-type vertebral bodies. Alignment: Normal lumbar lordosis. Vertebrae: Mild to moderate superior endplate compression fracture deformity at L1, with approximately 40% loss of height (sagittal image 37), and minimal retropulsion (sagittal image 35). Vertebral body heights are otherwise maintained. Inferior endplate Schmorl's node deformity at L5 (sagittal image 38). Paraspinal and other soft tissues: Mild atherosclerotic calcifications of the aortic arch and iliac arteries. Otherwise unremarkable. Disc levels: Mild degenerative changes at L2-3 and L5-S1. IMPRESSION: Mild to moderate superior endplate compression fracture deformity at L1, with approximately 40% loss  of height, and minimal retropulsion. Electronically Signed   By: Charline Bills M.D.   On: 02/10/2021 01:36   CT Hip Right Wo Contrast  Result Date: 02/10/2021 CLINICAL DATA:  Fall, right hip pain EXAM: CT OF THE RIGHT HIP WITHOUT CONTRAST TECHNIQUE: Multidetector CT imaging of the right hip was performed according to the standard protocol. Multiplanar CT image reconstructions were also generated. COMPARISON:  Right femur radiographs dated 02/09/2021 FINDINGS: Subcapital right hip fracture, impacted, with foreshortening. Mild subcutaneous stranding overlying the greater trochanter (series 5/image 64). No discrete hematoma. Visualized bony pelvis is intact. Right hip joint space is preserved. Soft tissue pelvis is unremarkable.  No pelvic ascites. IMPRESSION: Impacted subcapital right hip fracture, as above. Electronically Signed   By:  Charline Bills M.D.   On: 02/10/2021 01:41   DG Femur Min 2 Views Right  Result Date: 02/09/2021 CLINICAL DATA:  Status post fall. EXAM: RIGHT FEMUR 2 VIEWS COMPARISON:  None. FINDINGS: An impacted fracture deformity of indeterminate age is seen involving the junction of the superior aspect of the right femoral head and neck. There is no evidence of dislocation. Degenerative changes are seen in the form of joint space narrowing and acetabular sclerosis. Soft tissues are unremarkable. IMPRESSION: 1. Impacted fracture deformity of the right femoral head and neck. CT correlation is recommended. Electronically Signed   By: Aram Candela M.D.   On: 02/09/2021 22:22     Assessment and Plan:   Surgical clearance Patient without prior cardiac history.  Lives independently and uses walker for ambulation.  She has limited activity.  Her cardiac risk factor includes 20 pack-year tobacco smoking and family history of MI in 76s.  She denies chest pain or dyspnea. RCRI is 0.4%.   2. Tobacco smoking -Cessation recommended.   Dr. Flora Lipps to see.   For questions or updates, please contact [facility] Please consult [email or web address] for contact info under    Lorelei Pont, PA  02/10/2021 9:50 AM

## 2021-02-10 NOTE — Brief Op Note (Signed)
02/09/2021 - 02/10/2021  6:49 PM  PATIENT:  Carrie Martinez  70 y.o. female  PRE-OPERATIVE DIAGNOSIS:  Right hip nondisplaced femoral neck fracture  POST-OPERATIVE DIAGNOSIS:  Right hip nondisplaced femoral neck fracture  PROCEDURE:  Procedure(s): Closed reduction percutaneous cannulated screw fixation of right hip  SURGEON:  Surgeon(s) and Role:    Durene Romans, MD - Primary  PHYSICIAN ASSISTANT: None  ANESTHESIA:   general  EBL:  <20 cc cc   BLOOD ADMINISTERED:none  DRAINS: none   LOCAL MEDICATIONS USED:  NONE  SPECIMEN:  No Specimen  DISPOSITION OF SPECIMEN:  N/A  COUNTS:  YES  TOURNIQUET:  * Missing tourniquet times found for documented tourniquets in log: [id number] *  DICTATION: .Other Dictation: Dictation Number [id number]  PLAN OF CARE: Admit to inpatient   PATIENT DISPOSITION:  PACU - hemodynamically stable.   Delay start of Pharmacological VTE agent (>24hrs) due to surgical blood loss or risk of bleeding: no

## 2021-02-11 ENCOUNTER — Encounter (HOSPITAL_COMMUNITY): Payer: Self-pay | Admitting: Orthopedic Surgery

## 2021-02-11 LAB — CBC
HCT: 37.4 % (ref 36.0–46.0)
Hemoglobin: 12.4 g/dL (ref 12.0–15.0)
MCH: 33 pg (ref 26.0–34.0)
MCHC: 33.2 g/dL (ref 30.0–36.0)
MCV: 99.5 fL (ref 80.0–100.0)
Platelets: 161 10*3/uL (ref 150–400)
RBC: 3.76 MIL/uL — ABNORMAL LOW (ref 3.87–5.11)
RDW: 13.1 % (ref 11.5–15.5)
WBC: 9.1 10*3/uL (ref 4.0–10.5)
nRBC: 0 % (ref 0.0–0.2)

## 2021-02-11 LAB — BASIC METABOLIC PANEL
Anion gap: 11 (ref 5–15)
BUN: 6 mg/dL — ABNORMAL LOW (ref 8–23)
CO2: 22 mmol/L (ref 22–32)
Calcium: 8.8 mg/dL — ABNORMAL LOW (ref 8.9–10.3)
Chloride: 103 mmol/L (ref 98–111)
Creatinine, Ser: 0.52 mg/dL (ref 0.44–1.00)
GFR, Estimated: >60 mL/min (ref >60)
Glucose, Bld: 148 mg/dL — ABNORMAL HIGH (ref 70–99)
Potassium: 3.8 mmol/L (ref 3.5–5.1)
Sodium: 136 mmol/L (ref 135–145)

## 2021-02-11 MED ORDER — DIROXIMEL FUMARATE 231 MG PO CPDR
462.0000 mg | DELAYED_RELEASE_CAPSULE | Freq: Two times a day (BID) | ORAL | Status: DC
  Administered 2021-02-12 – 2021-02-17 (×10): 462 mg via ORAL
  Filled 2021-02-11 (×16): qty 2

## 2021-02-11 MED ORDER — CEFAZOLIN SODIUM-DEXTROSE 2-4 GM/100ML-% IV SOLN
2.0000 g | INTRAVENOUS | Status: AC
  Administered 2021-02-12: 2 g via INTRAVENOUS
  Filled 2021-02-11 (×2): qty 100

## 2021-02-11 MED ORDER — POLYETHYLENE GLYCOL 3350 17 G PO PACK
17.0000 g | PACK | Freq: Every day | ORAL | Status: DC
  Administered 2021-02-11 – 2021-02-14 (×3): 17 g via ORAL
  Filled 2021-02-11 (×4): qty 1

## 2021-02-11 MED ORDER — ENSURE ENLIVE PO LIQD
237.0000 mL | Freq: Two times a day (BID) | ORAL | Status: DC
  Administered 2021-02-11 – 2021-02-14 (×5): 237 mL via ORAL

## 2021-02-11 MED ORDER — ADULT MULTIVITAMIN W/MINERALS CH
1.0000 | ORAL_TABLET | Freq: Every day | ORAL | Status: DC
  Administered 2021-02-11 – 2021-02-17 (×6): 1 via ORAL
  Filled 2021-02-11 (×6): qty 1

## 2021-02-11 NOTE — Progress Notes (Signed)
PROGRESS NOTE    Carrie Martinez  TIW:[id number] DOB: 05-02-1951 DOA: 02/09/2021 PCP: Marlyn Corporal, PA   Brief Narrative: 70 year old with past medical history significant for MS, she uses a walker to ambulate, had a mechanical fall and was found to have right hip fracture and L1 compression fracture.  Cardiology has been consulted for preop clearance due to abnormal EKG. patient was cleared by cardiology for surgery.  Patient underwent in situ close reduction percutaneous, in situ percutaneous cannulated screw fixation of right femoral neck fracture by Dr. Charlann Boxer on 8/29.   Assessment & Plan:   Active Problems:   Closed right hip fracture, initial encounter (HCC)   Closed compression fracture of body of L1 vertebra (HCC)   Multiple sclerosis (HCC)   Hip fracture (HCC)   S/P ORIF right hip (open reduction internal fixation) fracture  1-Right hip fracture: -In the setting of mechanical fall. -Patient was clear by cardiology for surgery.  -Underwent in situ close reduction percutaneous, in situ percutaneous cannulated screw fixation of right femoral neck fracture by Dr. Charlann Boxer on 8/29. -Plan for SNF.  -On aspirin BID for DVT prophylaxis.  -Right hip 25 to 50 % weight bearing  -Vicodin and morphine PRN for pain.   2-L1 compression fracture: Dr Charlann Boxer will ask Dr Shon Baton to review L 1 compression fracture.  Awaiting Dr Shon Baton evaluation.   3-Leukocytosis: Reactive. Resolved.   4-MS: Continue with amantadine, Diroximel.  Continue with Cymbalta.   5-HLD; Resume Crestor.      Estimated body mass index is 19.97 kg/m as calculated from the following:   Height as of this encounter: 5\' 5"  (1.651 m).   Weight as of this encounter: 54.4 kg.   DVT prophylaxis: SCD, will need to start DVT prophylaxis post Sx Code Status: Full Code Family Communication: Care discussed with Son who was at bedside.  Disposition Plan:  Status is: Inpatient  Remains inpatient appropriate because:IV  treatments appropriate due to intensity of illness or inability to take PO  Dispo: The patient is from: Home              Anticipated d/c is to: SNF              Patient currently is not medically stable to d/c.DC in 1-2 days.    Difficult to place patient No        Consultants:  Cardiology Othropedic, Dr .   Procedures:  None  Antimicrobials:    Subjective: She report improvement of pain compare to yesterday prior to surgery. She is breathing ok, her son brought her MS medication (Diroximel)  Objective: Vitals:   02/11/21 0845 02/11/21 1135 02/11/21 1345 02/11/21 1516  BP: 118/60 (!) 126/58 (!) 125/59 127/61  Pulse: 66 60 (!) 56 74  Resp: 16  16 15   Temp:   98.1 F (36.7 C) 98.3 F (36.8 C)  TempSrc:   Oral Oral  SpO2: 97% 97% 97% 96%  Weight:      Height:        Intake/Output Summary (Last 24 hours) at 02/11/2021 1517 Last data filed at 02/11/2021 1300 Gross per 24 hour  Intake 1514.74 ml  Output 1340 ml  Net 174.74 ml   Filed Weights   02/09/21 2114  Weight: 54.4 kg    Examination:  General exam: NAD Respiratory system: CTA Cardiovascular system: S 1, S 2 RRR Gastrointestinal system: BS present, Soft, nt Central nervous system: Alert and oriented.  Extremities: clean dressing right hip  Data Reviewed: I have personally reviewed following labs and imaging studies  CBC: Recent Labs  Lab 02/09/21 2123 02/10/21 0457 02/11/21 0255  WBC 15.9* 9.9 9.1  NEUTROABS 13.3*  --   --   HGB 14.2 13.5 12.4  HCT 41.9 39.8 37.4  MCV 100.2* 99.7 99.5  PLT 199 182 161    Basic Metabolic Panel: Recent Labs  Lab 02/09/21 2123 02/10/21 0457 02/11/21 0255  NA 138 137 136  K 3.6 3.5 3.8  CL 103 104 103  CO2 22 25 22   GLUCOSE 110* 120* 148*  BUN 11 8 6*  CREATININE 0.56 0.48 0.52  CALCIUM 9.1 8.9 8.8*    GFR: Estimated Creatinine Clearance: 57 mL/min (by C-G formula based on SCr of 0.52 mg/dL). Liver Function Tests: No results for  input(s): AST, ALT, ALKPHOS, BILITOT, PROT, ALBUMIN in the last 168 hours. No results for input(s): LIPASE, AMYLASE in the last 168 hours. No results for input(s): AMMONIA in the last 168 hours. Coagulation Profile: No results for input(s): INR, PROTIME in the last 168 hours. Cardiac Enzymes: No results for input(s): CKTOTAL, CKMB, CKMBINDEX, TROPONINI in the last 168 hours. BNP (last 3 results) No results for input(s): PROBNP in the last 8760 hours. HbA1C: No results for input(s): HGBA1C in the last 72 hours. CBG: No results for input(s): GLUCAP in the last 168 hours. Lipid Profile: No results for input(s): CHOL, HDL, LDLCALC, TRIG, CHOLHDL, LDLDIRECT in the last 72 hours. Thyroid Function Tests: No results for input(s): TSH, T4TOTAL, FREET4, T3FREE, THYROIDAB in the last 72 hours. Anemia Panel: No results for input(s): VITAMINB12, FOLATE, FERRITIN, TIBC, IRON, RETICCTPCT in the last 72 hours. Sepsis Labs: No results for input(s): PROCALCITON, LATICACIDVEN in the last 168 hours.  Recent Results (from the past 240 hour(s))  Resp Panel by RT-PCR (Flu A&B, Covid) Nasopharyngeal Swab     Status: None   Collection Time: 02/10/21 12:03 AM   Specimen: Nasopharyngeal Swab; Nasopharyngeal(NP) swabs in vial transport medium  Result Value Ref Range Status   SARS Coronavirus 2 by RT PCR NEGATIVE NEGATIVE Final    Comment: (NOTE) SARS-CoV-2 target nucleic acids are NOT DETECTED.  The SARS-CoV-2 RNA is generally detectable in upper respiratory specimens during the acute phase of infection. The lowest concentration of SARS-CoV-2 viral copies this assay can detect is 138 copies/mL. A negative result does not preclude SARS-Cov-2 infection and should not be used as the sole basis for treatment or other patient management decisions. A negative result may occur with  improper specimen collection/handling, submission of specimen other than nasopharyngeal swab, presence of viral mutation(s) within  the areas targeted by this assay, and inadequate number of viral copies(<138 copies/mL). A negative result must be combined with clinical observations, patient history, and epidemiological information. The expected result is Negative.  Fact Sheet for Patients:  BloggerCourse.comhttps://www.fda.gov/media/152166/download  Fact Sheet for Healthcare Providers:  SeriousBroker.ithttps://www.fda.gov/media/152162/download  This test is no t yet approved or cleared by the [place] FDA and  has been authorized for detection and/or diagnosis of SARS-CoV-2 by FDA under an Emergency Use Authorization (EUA). This EUA will remain  in effect (meaning this test can be used) for the duration of the COVID-19 declaration under Section 564(b)(1) of the Act, 21 U.S.C.section 360bbb-3(b)(1), unless the authorization is terminated  or revoked sooner.       Influenza A by PCR NEGATIVE NEGATIVE Final   Influenza B by PCR NEGATIVE NEGATIVE Final    Comment: (NOTE) The Xpert Xpress SARS-CoV-2/FLU/RSV plus assay  is intended as an aid in the diagnosis of influenza from Nasopharyngeal swab specimens and should not be used as a sole basis for treatment. Nasal washings and aspirates are unacceptable for Xpert Xpress SARS-CoV-2/FLU/RSV testing.  Fact Sheet for Patients: BloggerCourse.com  Fact Sheet for Healthcare Providers: SeriousBroker.it  This test is not yet approved or cleared by the [place] FDA and has been authorized for detection and/or diagnosis of SARS-CoV-2 by FDA under an Emergency Use Authorization (EUA). This EUA will remain in effect (meaning this test can be used) for the duration of the COVID-19 declaration under Section 564(b)(1) of the Act, 21 U.S.C. section 360bbb-3(b)(1), unless the authorization is terminated or revoked.  Performed at [facility], [address] [place], [place] [address]   Surgical pcr screen     Status: None   Collection  Time: 02/10/21  6:16 PM   Specimen: Nasal Mucosa; Nasal Swab  Result Value Ref Range Status   MRSA, PCR NEGATIVE NEGATIVE Final   Staphylococcus aureus NEGATIVE NEGATIVE Final    Comment: (NOTE) The Xpert SA Assay (FDA approved for NASAL specimens in patients 52 years of age and older), is one component of a comprehensive surveillance program. It is not intended to diagnose infection nor to guide or monitor treatment. Performed at [facility], [address] [place], [place] [address]           Radiology Studies: DG Thoracic Spine 2 View  Result Date: 02/09/2021 CLINICAL DATA:  Status post fall. EXAM: THORACIC SPINE 2 VIEWS COMPARISON:  November 16, 2019 FINDINGS: There is no evidence of an acute thoracic spine fracture. Stable loss of vertebral body height is seen at the approximate level of T11. An ill-defined deformity is seen involving the superior endplate of the L1 vertebral body on the frontal view. This is not clearly visualized on the prior study. Alignment is normal. No other significant bone abnormalities are identified. IMPRESSION: Ill-defined deformity involving the superior endplate of the L1 vertebral body which may be chronic in nature. A mild acute fracture cannot be excluded. CT correlation is recommended. Electronically Signed   By: Aram Candela M.D.   On: 02/09/2021 22:21   DG Lumbar Spine Complete  Result Date: 02/09/2021 CLINICAL DATA:  Status post fall. EXAM: LUMBAR SPINE - COMPLETE 4+ VIEW COMPARISON:  November 16, 2019 FINDINGS: An acute compression fracture deformity is seen involving the L1 vertebral body. Mild levoscoliosis is noted. Mild multilevel endplate sclerosis is present. Intervertebral disc spaces are maintained. IMPRESSION: 1. Acute compression fracture of the L1 vertebral body. MRI correlation is recommended. Electronically Signed   By: Aram Candela M.D.   On: 02/09/2021 22:24   DG Pelvis 1-2 Views  Result Date: 02/09/2021 CLINICAL DATA:   Status post fall. EXAM: PELVIS - 1-2 VIEW COMPARISON:  None. FINDINGS: Evaluation of the right femur is limited secondary to patient positioning. There is no evidence of pelvic fracture or diastasis. Degenerative changes are seen involving both hips in the form of joint space narrowing and acetabular sclerosis. No pelvic bone lesions are seen. IMPRESSION: 1. No acute findings. Electronically Signed   By: Aram Candela M.D.   On: 02/09/2021 22:18   CT HEAD WO CONTRAST ( )  Result Date: 02/10/2021 CLINICAL DATA:  Fall from standing EXAM: CT HEAD WITHOUT CONTRAST CT CERVICAL SPINE WITHOUT CONTRAST TECHNIQUE: Multidetector CT imaging of the head and cervical spine was performed following the standard protocol without intravenous contrast. Multiplanar CT image reconstructions of the  cervical spine were also generated. COMPARISON:  None. FINDINGS: CT HEAD FINDINGS Brain: No evidence of acute infarction, hemorrhage, hydrocephalus, extra-axial collection or mass lesion/mass effect. Mild degenerative changes of the visualized thoracolumbar spine. Vascular: No hyperdense vessel or unexpected calcification. Skull: Normal. Negative for fracture or focal lesion. Sinuses/Orbits: The visualized paranasal sinuses are essentially clear. The mastoid air cells are unopacified. Other: None. CT CERVICAL SPINE FINDINGS Alignment: Normal cervical lordosis. Skull base and vertebrae: No acute fracture. No primary bone lesion or focal pathologic process. Soft tissues and spinal canal: No prevertebral fluid or swelling. No visible canal hematoma. Disc levels: Mild degenerative changes of the mid cervical spine. Spinal canal is patent. Upper chest: Visualized lung apices are notable for biapical pleural-parenchymal scarring and mild centrilobular and paraseptal emphysematous changes. Other: Visualized thyroid is notable for a 14 mm right thyroid nodule. Not clinically significant; no follow-up imaging recommended (ref: J Am Coll  Radiol. 2015 Feb;12(2): 143-50). IMPRESSION: No evidence of acute intracranial abnormality. Mild small vessel ischemic changes. No evidence of traumatic injury to the cervical spine. Mild degenerative changes. Electronically Signed   By: Charline Bills M.D.   On: 02/10/2021 01:33   CT Cervical Spine Wo Contrast  Result Date: 02/10/2021 CLINICAL DATA:  Fall from standing EXAM: CT HEAD WITHOUT CONTRAST CT CERVICAL SPINE WITHOUT CONTRAST TECHNIQUE: Multidetector CT imaging of the head and cervical spine was performed following the standard protocol without intravenous contrast. Multiplanar CT image reconstructions of the cervical spine were also generated. COMPARISON:  None. FINDINGS: CT HEAD FINDINGS Brain: No evidence of acute infarction, hemorrhage, hydrocephalus, extra-axial collection or mass lesion/mass effect. Mild degenerative changes of the visualized thoracolumbar spine. Vascular: No hyperdense vessel or unexpected calcification. Skull: Normal. Negative for fracture or focal lesion. Sinuses/Orbits: The visualized paranasal sinuses are essentially clear. The mastoid air cells are unopacified. Other: None. CT CERVICAL SPINE FINDINGS Alignment: Normal cervical lordosis. Skull base and vertebrae: No acute fracture. No primary bone lesion or focal pathologic process. Soft tissues and spinal canal: No prevertebral fluid or swelling. No visible canal hematoma. Disc levels: Mild degenerative changes of the mid cervical spine. Spinal canal is patent. Upper chest: Visualized lung apices are notable for biapical pleural-parenchymal scarring and mild centrilobular and paraseptal emphysematous changes. Other: Visualized thyroid is notable for a 14 mm right thyroid nodule. Not clinically significant; no follow-up imaging recommended (ref: J Am Coll Radiol. 2015 Feb;12(2): 143-50). IMPRESSION: No evidence of acute intracranial abnormality. Mild small vessel ischemic changes. No evidence of traumatic injury to the  cervical spine. Mild degenerative changes. Electronically Signed   By: Charline Bills M.D.   On: 02/10/2021 01:33   CT Lumbar Spine Wo Contrast  Result Date: 02/10/2021 CLINICAL DATA:  Fall, low back pain EXAM: CT LUMBAR SPINE WITHOUT CONTRAST TECHNIQUE: Multidetector CT imaging of the lumbar spine was performed without intravenous contrast administration. Multiplanar CT image reconstructions were also generated. COMPARISON:  None. FINDINGS: Segmentation: 5 lumbar-type vertebral bodies. Alignment: Normal lumbar lordosis. Vertebrae: Mild to moderate superior endplate compression fracture deformity at L1, with approximately 40% loss of height (sagittal image 37), and minimal retropulsion (sagittal image 35). Vertebral body heights are otherwise maintained. Inferior endplate Schmorl's node deformity at L5 (sagittal image 38). Paraspinal and other soft tissues: Mild atherosclerotic calcifications of the aortic arch and iliac arteries. Otherwise unremarkable. Disc levels: Mild degenerative changes at L2-3 and L5-S1. IMPRESSION: Mild to moderate superior endplate compression fracture deformity at L1, with approximately 40% loss of height, and minimal retropulsion. Electronically Signed  By: Charline Bills M.D.   On: 02/10/2021 01:36   CT Hip Right Wo Contrast  Result Date: 02/10/2021 CLINICAL DATA:  Fall, right hip pain EXAM: CT OF THE RIGHT HIP WITHOUT CONTRAST TECHNIQUE: Multidetector CT imaging of the right hip was performed according to the standard protocol. Multiplanar CT image reconstructions were also generated. COMPARISON:  Right femur radiographs dated 02/09/2021 FINDINGS: Subcapital right hip fracture, impacted, with foreshortening. Mild subcutaneous stranding overlying the greater trochanter (series 5/image 64). No discrete hematoma. Visualized bony pelvis is intact. Right hip joint space is preserved. Soft tissue pelvis is unremarkable.  No pelvic ascites. IMPRESSION: Impacted subcapital right  hip fracture, as above. Electronically Signed   By: Charline Bills M.D.   On: 02/10/2021 01:41   DG C-Arm 1-60 Min-No Report  Result Date: 02/10/2021 Fluoroscopy was utilized by the requesting physician.  No radiographic interpretation.   DG HIP OPERATIVE UNILAT WITH PELVIS RIGHT  Result Date: 02/10/2021 CLINICAL DATA:  Right hip fracture EXAM: OPERATIVE right HIP (WITH PELVIS IF PERFORMED) 2 VIEWS TECHNIQUE: Fluoroscopic spot image(s) were submitted for interpretation post-operatively. COMPARISON:  02/09/2021 FINDINGS: Two low resolution intraoperative spot views of the right hip. Total fluoroscopy time was 1 minutes 38 seconds. The images demonstrate threaded screw fixation of right femoral neck fracture. IMPRESSION: Intraoperative fluoroscopic assistance provided during surgical fixation of right hip fracture Electronically Signed   By: Jasmine Pang M.D.   On: 02/10/2021 20:17   DG Femur Min 2 Views Right  Result Date: 02/09/2021 CLINICAL DATA:  Status post fall. EXAM: RIGHT FEMUR 2 VIEWS COMPARISON:  None. FINDINGS: An impacted fracture deformity of indeterminate age is seen involving the junction of the superior aspect of the right femoral head and neck. There is no evidence of dislocation. Degenerative changes are seen in the form of joint space narrowing and acetabular sclerosis. Soft tissues are unremarkable. IMPRESSION: 1. Impacted fracture deformity of the right femoral head and neck. CT correlation is recommended. Electronically Signed   By: Aram Candela M.D.   On: 02/09/2021 22:22        Scheduled Meds:  amantadine  200 mg Oral Daily   aspirin  81 mg Oral BID WC   calcium-vitamin D  1 tablet Oral Q breakfast   Diroximel Fumarate  462 mg Oral BID   docusate sodium  100 mg Oral BID   DULoxetine  60 mg Oral Daily   feeding supplement  237 mL Oral BID BM   ferrous sulfate  325 mg Oral TID PC   multivitamin with minerals  1 tablet Oral Daily   polyethylene glycol  17 g  Oral Daily   rosuvastatin  5 mg Oral Daily   senna  1 tablet Oral QHS   vitamin B-12  100 mcg Oral Daily   Continuous Infusions:  sodium chloride 75 mL/hr at 02/10/21 2239   methocarbamol (ROBAXIN) IV       LOS: 1 day    Time spent: 35 minutes.     Alba Cory, MD [facility]   If 7PM-7AM, please contact night-coverage [email or web address]  02/11/2021, 3:17 PM

## 2021-02-11 NOTE — Progress Notes (Signed)
Initial Nutrition Assessment  DOCUMENTATION CODES:   Not applicable  INTERVENTION:   -Ensure Enlive po BID, each supplement provides 350 kcal and 20 grams of protein  -MVI with minerals daily  NUTRITION DIAGNOSIS:   Increased nutrient needs related to post-op healing as evidenced by estimated needs.  GOAL:   Patient will meet greater than or equal to 90% of their needs  MONITOR:   PO intake, Supplement acceptance, Labs, Weight trends, Skin, I & O's  REASON FOR ASSESSMENT:   Consult Assessment of nutrition requirement/status, Hip fracture protocol  ASSESSMENT:   Carrie Martinez is a 70 y.o. female with history of multiple sclerosis uses a walker to ambulate had a fall when patient was trying to reach and lost balance and fell onto her right side.  Denies losing consciousness.  Pt admitted with rt hip fracture s/p fall.   8/29- s/p PROCEDURE:  In situ closed reduction percutaneous, in situ percutaneous cannulated screw fixation of right femoral neck fracture.  Reviewed I/O's: +350 ml x 24 hours  UOP: 875 ml x 24 hours  Pt unavailable at time of visit. Attempted to speak with pt  x 3, however, in with MD or receiving nursing care at times of attempted visits. RD unable to obtain further nutrition-related history or complete nutrition-focused physical exam at this time.    No meal completion data available to assess at this time.   No wt hx available to review at this time.   Pt with increased nutritional needs for post-operative healing and would benefit from addition of oral nutrition supplements.   Medications reviewed and include calcium-vitamin D, colace, ferrous sulfate, senokot, and vitamin B-12.   Labs reviewed.   Diet Order:   Diet Order             Diet regular Room service appropriate? Yes; Fluid consistency: Thin  Diet effective now                   EDUCATION NEEDS:   No education needs have been identified at this time  Skin:  Skin  Assessment: Skin Integrity Issues: Skin Integrity Issues:: Incisions Incisions: closed rt hip  Last BM:  Unknown  Height:   Ht Readings from Last 1 Encounters:  02/09/21 5\' 5"  (1.651 m)    Weight:   Wt Readings from Last 1 Encounters:  02/09/21 54.4 kg    Ideal Body Weight:  56.8 kg  BMI:  Body mass index is 19.97 kg/m.  Estimated Nutritional Needs:   Kcal:  1650-1850  Protein:  80-95 grams  Fluid:  > 1.6 L    02/11/21, RD, LDN, CDCES Registered Dietitian II Certified Diabetes Care and Education Specialist Please refer to [facility] for RD and/or RD on-call/weekend/after hours pager

## 2021-02-11 NOTE — Plan of Care (Signed)
?  Problem: Clinical Measurements: ?Goal: Will remain free from infection ?Outcome: Progressing ?Goal: Diagnostic test results will improve ?Outcome: Progressing ?Goal: Respiratory complications will improve ?Outcome: Progressing ?  ?

## 2021-02-11 NOTE — Op Note (Signed)
NAMEKALIFA, CADDEN MEDICAL RECORD NO: [id number] ACCOUNT NO: [id number] DATE OF BIRTH: 1951-06-03 FACILITY: [facility] LOCATION: [facility] PHYSICIAN: Madlyn Frankel. Charlann Boxer, MD  Operative Report   DATE OF PROCEDURE: 02/10/2021  PREOPERATIVE DIAGNOSIS:  Nondisplaced right subcapital femoral neck fracture with valgus impaction.  POSTOPERATIVE DIAGNOSIS:  Nondisplaced right subcapital femoral neck fracture with valgus impaction.  PROCEDURE:  In situ closed reduction percutaneous, in situ percutaneous cannulated screw fixation of right femoral neck fracture.  COMPONENTS USED:  Biomet cannulated screw system with a size 8.0 mm x 70 mm with 40 mm cancellous threads in the inferior aspect of the femoral neck.  The second screw was 8.0 mm x 80 mm with 40 mm threads and the third screw anteriorly was an 8.0 x 70  mm with 16 mm threads.  SURGEON:  Madlyn Frankel. Charlann Boxer, MD  ASSISTANT:  Surgical team.  ANESTHESIA:  General.  ESTIMATED BLOOD LOSS:  Minimal.  COMPLICATIONS:  None apparent.  INDICATIONS FOR THE PROCEDURE:  The patient is a pleasant 70 year old female who is an unfortunate female with a medical history of multiple sclerosis.  This necessitates the use of a walker for balance and support.  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  She had an immediate onset of pain in her lower back as well as her right hip.  She was brought to the Emergency Room by EMS due to inability to bear weight.   The radiographic assessment in the emergency room revealed a nondisplaced femoral neck fracture, necessitating a CT scan to confirm.  In addition, she was noted to have an L1 compression fracture.  She was seen and evaluated.  I discussed with both her  and her son surgical treatment options including percutaneous cannulated screw fixation as opposed to hemiarthroplasty.  Given the fact that the CT scan showed a nearly anatomically  aligned subcapital femoral neck fracture, I felt that cannulated screws  would afford [place] the ability to try to get this bone to heal and maintain her native joint versus an arthroplasty option with an increased risk due to her falls for periprosthetic fracture or dislocation.  There would be minimal risk for bleeding or blood  clot, postoperatively as well as minimal risk for infection.  Consent was obtained for fracture management, reviewing risks again of infection, DVT, malunion, nonunion, and the need for future surgeries.  DESCRIPTION OF PROCEDURE:  The patient was brought to the operative theater.  Once adequate anesthesia, preoperative antibiotics, Ancef administered, she was carefully positioned onto the fracture table.  Her right foot was placed into the traction boot.   There was no significant traction applied only that to apply tension on her lower extremity to maintain an anatomic position.  The left lower extremity was then flexed and abducted out of the way with bony prominences padded.  Once I felt that she was  safely padded and secured on the table, we used fluoroscopy to confirm the maintenance of the reduction of her fracture.  This was confirmed in the AP and lateral planes.  At this point, the right hip was prepped and draped in sterile fashion.  A timeout  was performed identifying the patient, planned procedure, and extremity.  Fluoroscopy was brought back to the field.  I identified landmarks and with regards to the neck shaft angle as well as the orientation of the lateral plane.  We then made  an  incision around the area of the lesser trochanter.  I first inserted a guidewire into the center of the femoral neck on the lateral plane in the inferior aspect on the AP plane.  I then placed two other drill guides proximal to this one anterior and  posterior.  These were confirmed and performed under fluoroscopic guidance.  I then measured and selected depth.  I then measured and  selected the length of the screws.  I then drilled the outer cortex.  Initially, I was going to use 16 mm threads  however, based on identified bone quality, I ended up with 40 mm threads on the inferior and posterior screw.  I got decent purchase more posteriorly than the inferior.  On the anterior screw, I used 16 mm threads and got good purchase.  Final  radiographs were obtained in the AP and lateral planes.  I irrigated the wound laterally.  The iliotibial band was reapproximated using #1 Vicryl.  The remainder of the wound was closed with 2-0 Vicryl and a running Monocryl stitch.  The wound was then  cleaned, dried and dressed sterilely using surgical glue and Aquacel dressing.  She was then awoken from anesthesia and brought to the recovery room in stable condition, tolerating the procedure well.  Postoperatively, we will have her be limited weightbearing 25-50% weightbearing on this right lower extremity.  POSTOPERATIVE PLAN:  She will be evaluated for her compression fracture and consideration of kyphoplasty to help with pain control as well as mobilization.  Her disposition will be dependent on her functional abilities through assessment with physical  therapy.  Findings were reviewed with her son.   MUK D: 02/10/2021 8:12:06 pm T: 02/11/2021 12:42:00 am  JOB: [id number]/ [id number]

## 2021-02-11 NOTE — Progress Notes (Signed)
Patient ID: Carrie Martinez, female   DOB: 05/27/1951, 70 y.o.   MRN: [id number] Subjective: 1 Day Post-Op Procedure(s) (LRB): PERCUTANEOUS PINNING (Right)    Patient reports pain as mild with regards to hip as compared to back pain (L1 compression fracture)  Objective:   VITALS:   Vitals:   02/10/21 2158 02/11/21 0208  BP: (!) 122/50 (!) 121/56  Pulse: 81 83  Resp: (!) 25 17  Temp: 98.5 F (36.9 C) 98.2 F (36.8 C)  SpO2: 98% 100%    Neurovascular intact Incision: dressing C/D/I - right hip  LABS Recent Labs    02/09/21 2123 02/10/21 0457 02/11/21 0255  HGB 14.2 13.5 12.4  HCT 41.9 39.8 37.4  WBC 15.9* 9.9 9.1  PLT 199 182 161    Recent Labs    02/09/21 2123 02/10/21 0457 02/11/21 0255  NA 138 137 136  K 3.6 3.5 3.8  BUN 11 8 6*  CREATININE 0.56 0.48 0.52  GLUCOSE 110* 120* 148*    No results for input(s): LABPT, INR in the last 72 hours.   Assessment/Plan: 1 Day Post-Op Procedure(s) (LRB): PERCUTANEOUS PINNING (Right)   Advance diet - depending on Dr. Shon Baton' evaluation and plan Up with therapy - with regards to right hip 25-50% weight bearing - orders will be changed to reflect this based on bone quality identified at time of procedure  Disposition pending determined by PT and mobility/balance and safety issues RTC in 2 weeks for X-rays and wound check

## 2021-02-11 NOTE — Consult Note (Signed)
Chief Complaint: L1 compression fracture History: Carrie Martinez is an 70 y.o. female.  HPI: Carrie Martinez is a 70 y.o. female with history of multiple sclerosis uses a walker to ambulate had a fall when patient was trying to reach a smoke detector with a stick and lost balance and fell onto her right side hitting her stove and falling to the ground.  Denies losing consciousness.   Back hurting more than the right hip    ED Course: In the ER x-rays and CT scans were done which shows right hip fracture and L1 compression fracture.  Review of systems: MS, right hip fracture, no nausea or incontinence.  No loss of consciousness, blurry vision, headaches.   Past Medical History:  Diagnosis Date   Multiple sclerosis (HCC)     No Known Allergies  No current facility-administered medications on file prior to encounter.   Current Outpatient Medications on File Prior to Encounter  Medication Sig Dispense Refill   Amantadine HCl 100 MG tablet Take 200 mg by mouth daily.     calcium-vitamin D (OSCAL WITH D) 500-200 MG-UNIT tablet Take 1 tablet by mouth daily with breakfast.     Cyanocobalamin (VITAMIN B12 PO) Take 1 tablet by mouth daily.     Diroximel Fumarate (VUMERITY) 231 MG CPDR Take 462 mg by mouth in the morning and at bedtime.     DULoxetine (CYMBALTA) 60 MG capsule Take 60 mg by mouth daily.     gabapentin (NEURONTIN) 100 MG capsule Take 200-300 mg by mouth at bedtime as needed for sleep.     rosuvastatin (CRESTOR) 5 MG tablet Take 5 mg by mouth daily.     traZODone (DESYREL) 50 MG tablet Take 50 mg by mouth at bedtime as needed for sleep.      Physical Exam: Vitals:   02/11/21 1345 02/11/21 1516  BP: (!) 125/59 127/61  Pulse: (!) 56 74  Resp: 16 15  Temp: 98.1 F (36.7 C) 98.3 F (36.8 C)  SpO2: 97% 96%   Body mass index is 19.97 kg/m. Alert and oriented x3. No shortness of breath or chest pain. Abdomen is soft and nontender.  No loss of bowel bladder  control, no rebound tenderness Patient has underlying MS but she is grossly neurologically intact with no focal motor deficits in the lower extremity.  Negative Babinski test, no clonus, symmetrical 1+ deep tendon reflexes at the knee absent at the Achilles. recent ORIF of the right hip with dressings in place. Compartments are soft and nontender.  Image: DG Thoracic Spine 2 View  Result Date: 02/09/2021 CLINICAL DATA:  Status post fall. EXAM: THORACIC SPINE 2 VIEWS COMPARISON:  November 16, 2019 FINDINGS: There is no evidence of an acute thoracic spine fracture. Stable loss of vertebral body height is seen at the approximate level of T11. An ill-defined deformity is seen involving the superior endplate of the L1 vertebral body on the frontal view. This is not clearly visualized on the prior study. Alignment is normal. No other significant bone abnormalities are identified. IMPRESSION: Ill-defined deformity involving the superior endplate of the L1 vertebral body which may be chronic in nature. A mild acute fracture cannot be excluded. CT correlation is recommended. Electronically Signed   By: Aram Candela M.D.   On: 02/09/2021 22:21   DG Lumbar Spine Complete  Result Date: 02/09/2021 CLINICAL DATA:  Status post fall. EXAM: LUMBAR SPINE - COMPLETE 4+ VIEW COMPARISON:  November 16, 2019 FINDINGS: An acute  compression fracture deformity is seen involving the L1 vertebral body. Mild levoscoliosis is noted. Mild multilevel endplate sclerosis is present. Intervertebral disc spaces are maintained. IMPRESSION: 1. Acute compression fracture of the L1 vertebral body. MRI correlation is recommended. Electronically Signed   By: Aram Candela M.D.   On: 02/09/2021 22:24   DG Pelvis 1-2 Views  Result Date: 02/09/2021 CLINICAL DATA:  Status post fall. EXAM: PELVIS - 1-2 VIEW COMPARISON:  None. FINDINGS: Evaluation of the right femur is limited secondary to patient positioning. There is no evidence of pelvic  fracture or diastasis. Degenerative changes are seen involving both hips in the form of joint space narrowing and acetabular sclerosis. No pelvic bone lesions are seen. IMPRESSION: 1. No acute findings. Electronically Signed   By: Aram Candela M.D.   On: 02/09/2021 22:18   CT HEAD WO CONTRAST ( )  Result Date: 02/10/2021 CLINICAL DATA:  Fall from standing EXAM: CT HEAD WITHOUT CONTRAST CT CERVICAL SPINE WITHOUT CONTRAST TECHNIQUE: Multidetector CT imaging of the head and cervical spine was performed following the standard protocol without intravenous contrast. Multiplanar CT image reconstructions of the cervical spine were also generated. COMPARISON:  None. FINDINGS: CT HEAD FINDINGS Brain: No evidence of acute infarction, hemorrhage, hydrocephalus, extra-axial collection or mass lesion/mass effect. Mild degenerative changes of the visualized thoracolumbar spine. Vascular: No hyperdense vessel or unexpected calcification. Skull: Normal. Negative for fracture or focal lesion. Sinuses/Orbits: The visualized paranasal sinuses are essentially clear. The mastoid air cells are unopacified. Other: None. CT CERVICAL SPINE FINDINGS Alignment: Normal cervical lordosis. Skull base and vertebrae: No acute fracture. No primary bone lesion or focal pathologic process. Soft tissues and spinal canal: No prevertebral fluid or swelling. No visible canal hematoma. Disc levels: Mild degenerative changes of the mid cervical spine. Spinal canal is patent. Upper chest: Visualized lung apices are notable for biapical pleural-parenchymal scarring and mild centrilobular and paraseptal emphysematous changes. Other: Visualized thyroid is notable for a 14 mm right thyroid nodule. Not clinically significant; no follow-up imaging recommended (ref: J Am Coll Radiol. 2015 Feb;12(2): 143-50). IMPRESSION: No evidence of acute intracranial abnormality. Mild small vessel ischemic changes. No evidence of traumatic injury to the cervical  spine. Mild degenerative changes. Electronically Signed   By: Charline Bills M.D.   On: 02/10/2021 01:33   CT Cervical Spine Wo Contrast  Result Date: 02/10/2021 CLINICAL DATA:  Fall from standing EXAM: CT HEAD WITHOUT CONTRAST CT CERVICAL SPINE WITHOUT CONTRAST TECHNIQUE: Multidetector CT imaging of the head and cervical spine was performed following the standard protocol without intravenous contrast. Multiplanar CT image reconstructions of the cervical spine were also generated. COMPARISON:  None. FINDINGS: CT HEAD FINDINGS Brain: No evidence of acute infarction, hemorrhage, hydrocephalus, extra-axial collection or mass lesion/mass effect. Mild degenerative changes of the visualized thoracolumbar spine. Vascular: No hyperdense vessel or unexpected calcification. Skull: Normal. Negative for fracture or focal lesion. Sinuses/Orbits: The visualized paranasal sinuses are essentially clear. The mastoid air cells are unopacified. Other: None. CT CERVICAL SPINE FINDINGS Alignment: Normal cervical lordosis. Skull base and vertebrae: No acute fracture. No primary bone lesion or focal pathologic process. Soft tissues and spinal canal: No prevertebral fluid or swelling. No visible canal hematoma. Disc levels: Mild degenerative changes of the mid cervical spine. Spinal canal is patent. Upper chest: Visualized lung apices are notable for biapical pleural-parenchymal scarring and mild centrilobular and paraseptal emphysematous changes. Other: Visualized thyroid is notable for a 14 mm right thyroid nodule. Not clinically significant; no follow-up imaging recommended (ref: J  Am Coll Radiol. 2015 Feb;12(2): 143-50). IMPRESSION: No evidence of acute intracranial abnormality. Mild small vessel ischemic changes. No evidence of traumatic injury to the cervical spine. Mild degenerative changes. Electronically Signed   By: Charline Bills M.D.   On: 02/10/2021 01:33   CT Lumbar Spine Wo Contrast  Result Date:  02/10/2021 CLINICAL DATA:  Fall, low back pain EXAM: CT LUMBAR SPINE WITHOUT CONTRAST TECHNIQUE: Multidetector CT imaging of the lumbar spine was performed without intravenous contrast administration. Multiplanar CT image reconstructions were also generated. COMPARISON:  None. FINDINGS: Segmentation: 5 lumbar-type vertebral bodies. Alignment: Normal lumbar lordosis. Vertebrae: Mild to moderate superior endplate compression fracture deformity at L1, with approximately 40% loss of height (sagittal image 37), and minimal retropulsion (sagittal image 35). Vertebral body heights are otherwise maintained. Inferior endplate Schmorl's node deformity at L5 (sagittal image 38). Paraspinal and other soft tissues: Mild atherosclerotic calcifications of the aortic arch and iliac arteries. Otherwise unremarkable. Disc levels: Mild degenerative changes at L2-3 and L5-S1. IMPRESSION: Mild to moderate superior endplate compression fracture deformity at L1, with approximately 40% loss of height, and minimal retropulsion. Electronically Signed   By: Charline Bills M.D.   On: 02/10/2021 01:36   CT Hip Right Wo Contrast  Result Date: 02/10/2021 CLINICAL DATA:  Fall, right hip pain EXAM: CT OF THE RIGHT HIP WITHOUT CONTRAST TECHNIQUE: Multidetector CT imaging of the right hip was performed according to the standard protocol. Multiplanar CT image reconstructions were also generated. COMPARISON:  Right femur radiographs dated 02/09/2021 FINDINGS: Subcapital right hip fracture, impacted, with foreshortening. Mild subcutaneous stranding overlying the greater trochanter (series 5/image 64). No discrete hematoma. Visualized bony pelvis is intact. Right hip joint space is preserved. Soft tissue pelvis is unremarkable.  No pelvic ascites. IMPRESSION: Impacted subcapital right hip fracture, as above. Electronically Signed   By: Charline Bills M.D.   On: 02/10/2021 01:41   DG C-Arm 1-60 Min-No Report  Result Date:  02/10/2021 Fluoroscopy was utilized by the requesting physician.  No radiographic interpretation.   DG HIP OPERATIVE UNILAT WITH PELVIS RIGHT  Result Date: 02/10/2021 CLINICAL DATA:  Right hip fracture EXAM: OPERATIVE right HIP (WITH PELVIS IF PERFORMED) 2 VIEWS TECHNIQUE: Fluoroscopic spot image(s) were submitted for interpretation post-operatively. COMPARISON:  02/09/2021 FINDINGS: Two low resolution intraoperative spot views of the right hip. Total fluoroscopy time was 1 minutes 38 seconds. The images demonstrate threaded screw fixation of right femoral neck fracture. IMPRESSION: Intraoperative fluoroscopic assistance provided during surgical fixation of right hip fracture Electronically Signed   By: Jasmine Pang M.D.   On: 02/10/2021 20:17   DG Femur Min 2 Views Right  Result Date: 02/09/2021 CLINICAL DATA:  Status post fall. EXAM: RIGHT FEMUR 2 VIEWS COMPARISON:  None. FINDINGS: An impacted fracture deformity of indeterminate age is seen involving the junction of the superior aspect of the right femoral head and neck. There is no evidence of dislocation. Degenerative changes are seen in the form of joint space narrowing and acetabular sclerosis. Soft tissues are unremarkable. IMPRESSION: 1. Impacted fracture deformity of the right femoral head and neck. CT correlation is recommended. Electronically Signed   By: Aram Candela M.D.   On: 02/09/2021 22:22    A/P: Winni is a very pleasant 70 year old man who unfortunately fell and was diagnosed with a right hip fracture as well as an L1 compression fracture.  She recently had an ORIF of the right hip and has done well from that but she continues to have severe lumbar spine  pain.  CT scan and plain x-rays confirm an L1 compression fracture with no evidence of posterior retropulsed bone.  As result of the severity of her pain she like to move forward with an L1 kyphoplasty.  I have gone over the surgical procedure in great detail with her including  the risks, benefits, and alternatives.  Risks include: Infection, bleeding, nerve damage, leak of cement, ongoing or worse pain, additional fractures requiring further surgery, death, stroke, paralysis, blood clots.  Surgical plan is to move forward with a kyphoplasty with IV sedation and local anesthesia.  We will do this tomorrow.

## 2021-02-11 NOTE — Plan of Care (Signed)
  Problem: Health Behavior/Discharge Planning: Goal: Ability to manage health-related needs will improve Outcome: Progressing   Problem: Clinical Measurements: Goal: Ability to maintain clinical measurements within normal limits will improve Outcome: Progressing Goal: Will remain free from infection Outcome: Progressing   Problem: Activity: Goal: Risk for activity intolerance will decrease Outcome: Progressing   

## 2021-02-11 NOTE — TOC CAGE-AID Note (Signed)
Transition of Care [facility]) - CAGE-AID Screening   Patient Details  Name: Carrie Martinez MRN: [id number] Date of Birth: 1950-12-17  Transition of [facility]) CM/SW Contact:    Alexsia Klindt C Tarpley-Carter, LCSWA Phone Number: 02/11/2021, 3:19 PM   Clinical Narrative: Pt participated in Cage-Aid.  Pt stated she does not use substance or ETOH.  Pt was not offered resources, due to no usage of substance or ETOH.     Brelyn Woehl Tarpley-Carter, MSW, LCSW-A Pronouns:  She/Her/Hers [facility]Transitions of Care Clinical Social Worker Direct Number:  [phone] [email or web address]  CAGE-AID Screening:    Have You Ever Felt You Ought to Cut Down on Your Drinking or Drug Use?: No Have People Annoyed You By [employer] Your Drinking Or Drug Use?: No Have You Felt Bad Or Guilty About Your Drinking Or Drug Use?: No Have You Ever Had a Drink or Used Drugs First Thing In The Morning to Steady Your Nerves or to Get Rid of a Hangover?: No CAGE-AID Score: 0  Substance Abuse Education Offered: No

## 2021-02-11 NOTE — Evaluation (Signed)
Physical Therapy Evaluation Patient Details Name: Carrie Martinez MRN: [id number] DOB: 1950-10-28 Today's Date: 02/11/2021   History of Present Illness  70yo female admitted 02/10/21 after a fall at home. Received percutaneous pinning R hip 8/29, may also potentially receive kyphoplasty for acute L1 compression fracture as well. PMH MS  Clinical Impression   Patient received in bed, pleasant and cooperative but limited by pain and anxiety today. Needed +2 for rolling side to side (NT able to assist), able to get to EOB with maxAx1 but with strong posterior lean and severe FOF. Will need +2 to progress to transfers and gait training given hx of progressive MS with limited UE strength and WB precautions RLE. Left in bed positioned to comfort with all needs met, bed alarm active. Will benefit from rehab in SNF setting.     Follow Up Recommendations SNF;Supervision/Assistance - 24 hour    Equipment Recommendations  Rolling walker with 5" wheels;3in1 (PT);Wheelchair (measurements PT);Wheelchair cushion (measurements PT)    Recommendations for Other Services       Precautions / Restrictions Precautions Precautions: Fall;Other (comment) Precaution Comments: hx progressive MS, L1 compression fx pending possible kyphoplasty (use back precautions) Restrictions Weight Bearing Restrictions: Yes RLE Weight Bearing: Partial weight bearing RLE Partial Weight Bearing Percentage or Pounds: 25-50% R LE      Mobility  Bed Mobility Overal bed mobility: Needs Assistance Bed Mobility: Rolling;Supine to Sit;Sit to Supine Rolling: Max assist;+2 for physical assistance   Supine to sit: Max assist Sit to supine: Max assist   General bed mobility comments: maxAx2 for rolling with use of rail due to pain; able to get to/from EOB with MaxA and extended time/heavy cues for sequencing    Transfers                 General transfer comment: deferred- will need +2  Ambulation/Gait              General Gait Details: deferred- will need +2  Stairs            Wheelchair Mobility    Modified Rankin (Stroke Patients Only)       Balance Overall balance assessment: Needs assistance;History of Falls Sitting-balance support: Bilateral upper extremity supported;Feet supported Sitting balance-Leahy Scale: Poor Sitting balance - Comments: strong posterior lean Postural control: Posterior lean                                   Pertinent Vitals/Pain Pain Assessment: 0-10 Pain Score: 7  Pain Location: low back and R LE Pain Descriptors / Indicators: Numbness;Sharp Pain Intervention(s): Limited activity within patient's tolerance;Monitored during session;Patient requesting pain meds-RN notified    Home Living Family/patient expects to be discharged to:: Private residence Living Arrangements: Alone Available Help at Discharge: Family;Other (Comment);Available PRN/intermittently (son lives in [place], has neighbors and other family members who can help) Type of Home: House Home Access: Stairs to enter Entrance Stairs-Rails: Left Entrance Stairs-Number of Steps: 1 STE with U grab bar always has someone to help with the step Home Layout: One level Home Equipment: Shower seat;Grab bars - tub/shower;Hand held shower head;Toilet riser;Walker - 4 wheels;Walker - 2 wheels Additional Comments: hx of MS    Prior Function Level of Independence: Needs assistance   Gait / Transfers Assistance Needed: able to walk around in home with walkers, does consistently need help with steps  ADL's / Homemaking Assistance Needed: indepedent with bathing/dressing with  adaptive equipment/DME  Comments: does not drive, is mostly in the house; son does most of bills, pt only does some; pt manages meds     Hand Dominance        Extremity/Trunk Assessment   Upper Extremity Assessment Upper Extremity Assessment: Defer to OT evaluation    Lower Extremity Assessment Lower  Extremity Assessment: Generalized weakness    Cervical / Trunk Assessment Cervical / Trunk Assessment: Kyphotic  Communication   Communication: No difficulties  Cognition Arousal/Alertness: Awake/alert Behavior During Therapy: WFL for tasks assessed/performed;Anxious Overall Cognitive Status: Within Functional Limits for tasks assessed                                        General Comments General comments (skin integrity, edema, etc.): SpO2 90% on room air at lowest, HR 110 at highest    Exercises     Assessment/Plan    PT Assessment Patient needs continued PT services  PT Problem List Decreased strength;Decreased knowledge of use of DME;Decreased activity tolerance;Decreased safety awareness;Decreased balance;Decreased knowledge of precautions;Pain;Decreased mobility;Impaired sensation       PT Treatment Interventions DME instruction;Balance training;Gait training;Functional mobility training;Patient/family education;Therapeutic activities;Wheelchair mobility training;Therapeutic exercise    PT Goals (Current goals can be found in the Care Plan section)  Acute Rehab PT Goals Patient Stated Goal: less pain PT Goal Formulation: With patient/family Time For Goal Achievement: 02/25/21 Potential to Achieve Goals: Fair    Frequency Min 3X/week   Barriers to discharge        Co-evaluation               AM-PAC PT "6 Clicks" Mobility  Outcome Measure Help needed turning from your back to your side while in a flat bed without using bedrails?: Total Help needed moving from lying on your back to sitting on the side of a flat bed without using bedrails?: A Lot Help needed moving to and from a bed to a chair (including a wheelchair)?: Total Help needed standing up from a chair using your arms (e.g., wheelchair or bedside chair)?: Total Help needed to walk in hospital room?: Total Help needed climbing 3-5 steps with a railing? : Total 6 Click Score: 7     End of Session   Activity Tolerance: Patient limited by pain Patient left: in bed;with call bell/phone within reach;with bed alarm set;with family/visitor present Nurse Communication: Mobility status;Precautions;Weight bearing status PT Visit Diagnosis: Difficulty in walking, not elsewhere classified (R26.2);Other abnormalities of gait and mobility (R26.89);Muscle weakness (generalized) (M62.81);Other symptoms and signs involving the nervous system (R29.898);Pain Pain - Right/Left: Right Pain - part of body: Hip    Time: 7340-3709 PT Time Calculation (min) (ACUTE ONLY): 35 min   Charges:   PT Evaluation $PT Eval Moderate Complexity: 1 Mod PT Treatments $Therapeutic Activity: 8-22 mins       Windell Norfolk, DPT, PN2   Supplemental Physical Therapist [place]    Pager [phone] Acute Rehab Office [phone]

## 2021-02-11 NOTE — Plan of Care (Signed)

## 2021-02-12 ENCOUNTER — Inpatient Hospital Stay (HOSPITAL_COMMUNITY): Payer: PPO | Admitting: Anesthesiology

## 2021-02-12 ENCOUNTER — Inpatient Hospital Stay (HOSPITAL_COMMUNITY): Payer: PPO

## 2021-02-12 ENCOUNTER — Encounter (HOSPITAL_COMMUNITY)
Admission: EM | Disposition: A | Discharge status: Skilled Nursing Facility | Payer: Self-pay | Source: Home / Self Care | Attending: Family Medicine

## 2021-02-12 ENCOUNTER — Encounter (HOSPITAL_COMMUNITY): Payer: Self-pay | Admitting: Internal Medicine

## 2021-02-12 DIAGNOSIS — Z8781 Personal history of (healed) traumatic fracture: Secondary | ICD-10-CM

## 2021-02-12 DIAGNOSIS — Z9889 Other specified postprocedural states: Secondary | ICD-10-CM

## 2021-02-12 DIAGNOSIS — G35 Multiple sclerosis: Secondary | ICD-10-CM

## 2021-02-12 DIAGNOSIS — S32000A Wedge compression fracture of unspecified lumbar vertebra, initial encounter for closed fracture: Secondary | ICD-10-CM | POA: Diagnosis present

## 2021-02-12 HISTORY — PX: KYPHOPLASTY: SHX5884

## 2021-02-12 LAB — CBC
HCT: 35.4 % — ABNORMAL LOW (ref 36.0–46.0)
Hemoglobin: 12 g/dL (ref 12.0–15.0)
MCH: 33.3 pg (ref 26.0–34.0)
MCHC: 33.9 g/dL (ref 30.0–36.0)
MCV: 98.3 fL (ref 80.0–100.0)
Platelets: 156 10*3/uL (ref 150–400)
RBC: 3.6 MIL/uL — ABNORMAL LOW (ref 3.87–5.11)
RDW: 13.1 % (ref 11.5–15.5)
WBC: 8.8 10*3/uL (ref 4.0–10.5)
nRBC: 0 % (ref 0.0–0.2)

## 2021-02-12 SURGERY — KYPHOPLASTY
Anesthesia: Monitor Anesthesia Care | Site: Spine Lumbar

## 2021-02-12 MED ORDER — PROPOFOL 500 MG/50ML IV EMUL
INTRAVENOUS | Status: DC | PRN
  Administered 2021-02-12: 75 ug/kg/min via INTRAVENOUS

## 2021-02-12 MED ORDER — ORAL CARE MOUTH RINSE: 15.0000 mL | Freq: Once | OROMUCOSAL | Status: AC

## 2021-02-12 MED ORDER — MIDAZOLAM HCL 2 MG/2ML IJ SOLN
INTRAMUSCULAR | Status: AC
  Filled 2021-02-12: qty 2

## 2021-02-12 MED ORDER — BUPIVACAINE-EPINEPHRINE 0.25% -1:200000 IJ SOLN
INTRAMUSCULAR | Status: DC | PRN
  Administered 2021-02-12: 5 mL
  Administered 2021-02-12: 8 mL

## 2021-02-12 MED ORDER — ONDANSETRON HCL 4 MG/2ML IJ SOLN
INTRAMUSCULAR | Status: DC | PRN
  Administered 2021-02-12: 4 mg via INTRAVENOUS

## 2021-02-12 MED ORDER — LIDOCAINE 2% (20 MG/ML) 5 ML SYRINGE
INTRAMUSCULAR | Status: AC
  Filled 2021-02-12: qty 5

## 2021-02-12 MED ORDER — LACTATED RINGERS IV SOLN: INTRAVENOUS | Status: DC

## 2021-02-12 MED ORDER — SODIUM CHLORIDE 0.9% FLUSH
3.0000 mL | Freq: Two times a day (BID) | INTRAVENOUS | Status: DC
  Administered 2021-02-13 – 2021-02-17 (×9): 3 mL via INTRAVENOUS

## 2021-02-12 MED ORDER — SODIUM CHLORIDE 0.9 % IV SOLN: 250.0000 mL | INTRAVENOUS | Status: DC

## 2021-02-12 MED ORDER — BUPIVACAINE LIPOSOME 1.3 % IJ SUSP
INTRAMUSCULAR | Status: AC
  Filled 2021-02-12: qty 20

## 2021-02-12 MED ORDER — FENTANYL CITRATE (PF) 100 MCG/2ML IJ SOLN
INTRAMUSCULAR | Status: DC | PRN
  Administered 2021-02-12: 50 ug via INTRAVENOUS

## 2021-02-12 MED ORDER — PROPOFOL 10 MG/ML IV BOLUS
INTRAVENOUS | Status: AC
  Filled 2021-02-12: qty 20

## 2021-02-12 MED ORDER — IOPAMIDOL (ISOVUE-300) INJECTION 61%
INTRAVENOUS | Status: DC | PRN
  Administered 2021-02-12: 50 mL

## 2021-02-12 MED ORDER — ASPIRIN 81 MG PO CHEW
81.0000 mg | CHEWABLE_TABLET | Freq: Two times a day (BID) | ORAL | Status: DC
  Administered 2021-02-14 – 2021-02-17 (×8): 81 mg via ORAL
  Filled 2021-02-12 (×9): qty 1

## 2021-02-12 MED ORDER — MIDAZOLAM HCL 5 MG/5ML IJ SOLN
INTRAMUSCULAR | Status: DC | PRN
  Administered 2021-02-12 (×2): 1 mg via INTRAVENOUS

## 2021-02-12 MED ORDER — BUPIVACAINE-EPINEPHRINE (PF) 0.25% -1:200000 IJ SOLN
INTRAMUSCULAR | Status: AC
  Filled 2021-02-12: qty 30

## 2021-02-12 MED ORDER — CHLORHEXIDINE GLUCONATE 0.12 % MT SOLN: 15.0000 mL | Freq: Once | OROMUCOSAL | Status: AC

## 2021-02-12 MED ORDER — LIDOCAINE HCL (CARDIAC) PF 100 MG/5ML IV SOSY
PREFILLED_SYRINGE | INTRAVENOUS | Status: DC | PRN
  Administered 2021-02-12: 40 mg via INTRATRACHEAL

## 2021-02-12 MED ORDER — BUPIVACAINE LIPOSOME 1.3 % IJ SUSP
INTRAMUSCULAR | Status: DC | PRN
  Administered 2021-02-12: 5 mL

## 2021-02-12 MED ORDER — ONDANSETRON HCL 4 MG/2ML IJ SOLN
INTRAMUSCULAR | Status: AC
  Filled 2021-02-12: qty 2

## 2021-02-12 MED ORDER — 0.9 % SODIUM CHLORIDE (POUR BTL) OPTIME
TOPICAL | Status: DC | PRN
  Administered 2021-02-12: 1000 mL

## 2021-02-12 MED ORDER — SODIUM CHLORIDE 0.9% FLUSH: 3.0000 mL | INTRAVENOUS | Status: DC | PRN

## 2021-02-12 MED ORDER — FENTANYL CITRATE (PF) 250 MCG/5ML IJ SOLN
INTRAMUSCULAR | Status: AC
  Filled 2021-02-12: qty 5

## 2021-02-12 MED ORDER — DEXMEDETOMIDINE (PRECEDEX) IN NS 20 MCG/5ML (4 MCG/ML) IV SYRINGE
PREFILLED_SYRINGE | INTRAVENOUS | Status: DC | PRN
  Administered 2021-02-12 (×3): 4 ug via INTRAVENOUS

## 2021-02-12 MED ORDER — CHLORHEXIDINE GLUCONATE 0.12 % MT SOLN
OROMUCOSAL | Status: AC
  Administered 2021-02-12: 15 mL via OROMUCOSAL
  Filled 2021-02-12: qty 15

## 2021-02-12 SURGICAL SUPPLY — 45 items
ADH SKN CLS APL DERMABOND .7 (GAUZE/BANDAGES/DRESSINGS) ×1
BAG COUNTER SPONGE SURGICOUNT (BAG) IMPLANT
BAG SPNG CNTER NS LX DISP (BAG)
BLADE SURG 15 STRL LF DISP TIS (BLADE) ×1 IMPLANT
BLADE SURG 15 STRL SS (BLADE) ×2
BNDG ADH 1X3 SHEER STRL LF (GAUZE/BANDAGES/DRESSINGS) ×4 IMPLANT
BNDG ADH THN 3X1 STRL LF (GAUZE/BANDAGES/DRESSINGS) ×2
CEMENT KYPHON CX01A KIT/MIXER (Cement) ×2 IMPLANT
COVER MAYO STAND STRL (DRAPES) ×1 IMPLANT
COVER SURGICAL LIGHT HANDLE (MISCELLANEOUS) ×2 IMPLANT
CURETTE EXPRESS SZ2 7MM (INSTRUMENTS) IMPLANT
CURETTE WEDGE 8.5MM KYPHX (MISCELLANEOUS) IMPLANT
CURRETTE EXPRESS SZ2 7MM (INSTRUMENTS)
DERMABOND ADVANCED (GAUZE/BANDAGES/DRESSINGS) ×1
DERMABOND ADVANCED .7 DNX12 (GAUZE/BANDAGES/DRESSINGS) ×1 IMPLANT
DRAIN CHANNEL 15F RND FF W/TCR (WOUND CARE) IMPLANT
DRAPE C-ARM 42X72 X-RAY (DRAPES) ×4 IMPLANT
DRAPE INCISE IOBAN 66X45 STRL (DRAPES) ×2 IMPLANT
DRAPE LAPAROTOMY T 102X78X121 (DRAPES) ×2 IMPLANT
DRAPE WARM FLUID 44X44 (DRAPES) ×2 IMPLANT
DURAPREP 26ML APPLICATOR (WOUND CARE) ×2 IMPLANT
GLOVE SURG ENC MOIS LTX SZ6.5 (GLOVE) ×2 IMPLANT
GLOVE SURG MICRO LTX SZ8.5 (GLOVE) ×2 IMPLANT
GLOVE SURG UNDER POLY LF SZ6.5 (GLOVE) ×2 IMPLANT
GLOVE SURG UNDER POLY LF SZ8.5 (GLOVE) IMPLANT
GOWN STRL REUS W/ TWL LRG LVL3 (GOWN DISPOSABLE) ×2 IMPLANT
GOWN STRL REUS W/TWL 2XL LVL3 (GOWN DISPOSABLE) ×2 IMPLANT
GOWN STRL REUS W/TWL LRG LVL3 (GOWN DISPOSABLE) ×4
KIT BASIN OR (CUSTOM PROCEDURE TRAY) ×2 IMPLANT
KIT TURNOVER KIT B (KITS) ×2 IMPLANT
NDL SPNL 22GX3.5 QUINCKE BK (NEEDLE) ×1 IMPLANT
NEEDLE HYPO 22GX1.5 SAFETY (NEEDLE) ×2 IMPLANT
NEEDLE SPNL 22GX3.5 QUINCKE BK (NEEDLE) ×2 IMPLANT
NS IRRIG 1000ML POUR BTL (IV SOLUTION) ×2 IMPLANT
PACK SURGICAL SETUP 50X90 (CUSTOM PROCEDURE TRAY) ×2 IMPLANT
PAD ARMBOARD 7.5X6 YLW CONV (MISCELLANEOUS) ×4 IMPLANT
SPONGE T-LAP 4X18 ~~LOC~~+RFID (SPONGE) ×2 IMPLANT
SUT MNCRL AB 3-0 PS2 18 (SUTURE) ×2 IMPLANT
SYR 30ML BONE CEMENT XPNDR (MISCELLANEOUS) ×4
SYR CONTROL 10ML LL (SYRINGE) ×2 IMPLANT
SYRINGE 30ML BONE CEMENT XPNDR (MISCELLANEOUS) IMPLANT
TOWEL GREEN STERILE (TOWEL DISPOSABLE) ×2 IMPLANT
TRAY KYPHOPAK 15/3 ONESTEP 1ST (MISCELLANEOUS) IMPLANT
TRAY KYPHOPAK 20/3 ONESTEP 1ST (MISCELLANEOUS) ×1 IMPLANT
WATER STERILE IRR 1000ML POUR (IV SOLUTION) IMPLANT

## 2021-02-12 NOTE — Brief Op Note (Signed)
02/12/2021  1:47 PM  PATIENT:  Carrie Martinez  70 y.o. female  PRE-OPERATIVE DIAGNOSIS:  L1 COMPRESSION FRACTURE  POST-OPERATIVE DIAGNOSIS:  L1 COMPRESSION FRACTURE  PROCEDURE:  Procedure(s): LUMBAR ONE KYPHOPLASTY (N/A)  SURGEON:  Surgeon(s) and Role:    Venita Lick, MD - Primary  PHYSICIAN ASSISTANT:   ASSISTANTS: none   ANESTHESIA:   local and IV sedation  EBL:  5 mL   BLOOD ADMINISTERED:none  DRAINS: none   LOCAL MEDICATIONS USED:  MARCAINE    and OTHER Exparel  SPECIMEN:  No Specimen  DISPOSITION OF SPECIMEN:  N/A  COUNTS:  YES  TOURNIQUET:  * No tourniquets in log *  DICTATION: .Dragon Dictation  PLAN OF CARE: Admit to inpatient   PATIENT DISPOSITION:  PACU - hemodynamically stable.

## 2021-02-12 NOTE — Progress Notes (Signed)
Physical Therapy Treatment Patient Details Name: Carrie Martinez MRN: [id number] DOB: Aug 11, 1950 Today's Date: 02/12/2021    History of Present Illness 70yo female admitted 02/10/21 after a fall at home. Received percutaneous pinning R hip 8/29, may also potentially receive kyphoplasty for acute L1 compression fracture as well. PMH MS    PT Comments    Patient received in bed, pleasant and cooperative today. Continues to require heavy physical assistance for all aspects of mobility, but was able to progress to standing in the stedy today with Mod-MaxAx2 with cues for technique/sequencing for maintaining PWB precautions R LE. Once up, able to maintain PWB precautions relatively well in stedy-  but would likely be much more difficult with RW. 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 Left in bed positioned to comfort with bed alarm active awaiting transport to kyphoplasty. Continue to recommend SNF.    02/12/21 1012  Vital Signs  Patient Position (if appropriate) Orthostatic Vitals  Orthostatic Lying   BP- Lying 111/59  Pulse- Lying 60  Orthostatic Sitting  BP- Sitting 118/75  Pulse- Sitting 73  Orthostatic Standing at 0 minutes  BP- Standing at 0 minutes 108/68  Orthostatic Standing at 3 minutes  BP- Standing at 3 minutes 100/63     Follow Up Recommendations  SNF;Supervision/Assistance - 24 hour     Equipment Recommendations  Rolling walker with 5" wheels;3in1 (PT);Wheelchair (measurements PT);Wheelchair cushion (measurements PT)    Recommendations for Other Services       Precautions / Restrictions Precautions Precautions: Fall;Other (comment) Precaution Comments: hx progressive MS, L1 compression fx pending possible kyphoplasty (use back precautions) Restrictions Weight Bearing Restrictions: Yes RLE Weight Bearing: Partial weight bearing RLE Partial Weight Bearing Percentage or Pounds:  25-50% PWB R LE    Mobility  Bed Mobility Overal bed mobility: Needs Assistance Bed Mobility: Rolling;Sidelying to Sit;Sit to Sidelying Rolling: Max assist Sidelying to sit: Max assist;+2 for physical assistance     Sit to sidelying: Max assist;+2 for physical assistance General bed mobility comments: able to tolerate rolling better today with use of rail but still painful- still requires heavy assist to go from side to sit due to pain and weakness with strong posterior lean once at EOB    Transfers Overall transfer level: Needs assistance Equipment used: Ambulation equipment used Transfers: Sit to/from Stand Sit to Stand: Mod assist;+2 physical assistance         General transfer comment: ModAx2 to boost to standing in stedy- max cues for technique to shift weight over to L LE/maintain PWB precautions R LE but once up able to maintain precautions well. Tolerated standing in stedy about 7-8 minutes  Ambulation/Gait             General Gait Details: unable   Stairs             Wheelchair Mobility    Modified Rankin (Stroke Patients Only)       Balance Overall balance assessment: Needs assistance;History of Falls Sitting-balance support: Bilateral upper extremity supported;Feet supported Sitting balance-Leahy Scale: Poor Sitting balance - Comments: strong posterior lean Postural control: Posterior lean Standing balance support: Bilateral upper extremity supported;During functional activity Standing balance-Leahy Scale: Poor Standing balance comment: posterior lean even with BUE support                            Cognition Arousal/Alertness: Awake/alert Behavior During  Therapy: WFL for tasks assessed/performed;Anxious Overall Cognitive Status: Within Functional Limits for tasks assessed                                 General Comments: WFL and appropriate, just poor safety awareness/problem solving at baseline      Exercises       General Comments General comments (skin integrity, edema, etc.): VSS on RA, orthostatics negative (see note)      Pertinent Vitals/Pain Pain Assessment: Faces Faces Pain Scale: Hurts whole lot Pain Location: low back and R LE Pain Descriptors / Indicators: Numbness;Sharp;Discomfort;Aching Pain Intervention(s): Limited activity within patient's tolerance;Monitored during session;RN gave pain meds during session (on IV pain meds during session)    Home Living                      Prior Function            PT Goals (current goals can now be found in the care plan section) Acute Rehab PT Goals Patient Stated Goal: less pain PT Goal Formulation: With patient/family Time For Goal Achievement: 02/25/21 Potential to Achieve Goals: Fair Progress towards PT goals: Progressing toward goals    Frequency    Min 3X/week      PT Plan Current plan remains appropriate    Co-evaluation PT/OT/SLP Co-Evaluation/Treatment: Yes Reason for Co-Treatment: Complexity of the patient's impairments (multi-system involvement);For patient/therapist safety;To address functional/ADL transfers PT goals addressed during session: Mobility/safety with mobility;Balance;Proper use of DME        AM-PAC PT "6 Clicks" Mobility   Outcome Measure  Help needed turning from your back to your side while in a flat bed without using bedrails?: A Lot Help needed moving from lying on your back to sitting on the side of a flat bed without using bedrails?: Total Help needed moving to and from a bed to a chair (including a wheelchair)?: Total Help needed standing up from a chair using your arms (e.g., wheelchair or bedside chair)?: Total Help needed to walk in hospital room?: Total Help needed climbing 3-5 steps with a railing? : Total 6 Click Score: 7    End of Session Equipment Utilized During Treatment: Gait belt Activity Tolerance: Patient tolerated treatment well Patient left: in bed;with  call bell/phone within reach;with bed alarm set Nurse Communication: Mobility status;Precautions;Weight bearing status;Need for lift equipment PT Visit Diagnosis: Difficulty in walking, not elsewhere classified (R26.2);Other abnormalities of gait and mobility (R26.89);Muscle weakness (generalized) (M62.81);Other symptoms and signs involving the nervous system (R29.898);Pain Pain - Right/Left: Right Pain - part of body: Hip     Time: 4098-1191 PT Time Calculation (min) (ACUTE ONLY): 30 min  Charges:  $Therapeutic Activity: 8-22 mins (co-tx with OT)                    Lerry Liner PT, DPT, PN2   Supplemental Physical Therapist [facility]    Pager [phone] Acute Rehab Office [phone]

## 2021-02-12 NOTE — Plan of Care (Signed)

## 2021-02-12 NOTE — Anesthesia Procedure Notes (Signed)
Procedure Name: MAC Date/Time: 02/12/2021 12:50 PM Performed by: Michele Rockers, CRNA Pre-anesthesia Checklist: Patient identified, Emergency Drugs available, Suction available, Timeout performed and Patient being monitored Patient Re-evaluated:Patient Re-evaluated prior to induction Oxygen Delivery Method: Nasal Cannula

## 2021-02-12 NOTE — Transfer of Care (Signed)
Immediate Anesthesia Transfer of Care Note  Patient: Carrie Martinez  Procedure(s) Performed: LUMBAR ONE KYPHOPLASTY (Spine Lumbar)  Patient Location: PACU  Anesthesia Type:MAC  Level of Consciousness: drowsy, patient cooperative and responds to stimulation  Airway & Oxygen Therapy: Patient Spontanous Breathing and Patient connected to nasal cannula oxygen  Post-op Assessment: Report given to RN, Post -op Vital signs reviewed and stable and Patient moving all extremities X 4  Post vital signs: Reviewed and stable  Last Vitals:  Vitals Value Taken Time  BP 120/48 02/12/21 1351  Temp    Pulse 73 02/12/21 1351  Resp 28 02/12/21 1351  SpO2 97 % 02/12/21 1351  Vitals shown include unvalidated device data.  Last Pain:  Vitals:   02/12/21 1106  TempSrc: Oral  PainSc:       Patients Stated Pain Goal: 0 (98/42/10 3128)  Complications: No notable events documented.

## 2021-02-12 NOTE — Progress Notes (Signed)
Occupational Therapy Evaluation Patient Details Name: Carrie Martinez MRN: [id number] DOB: 08/01/1950 Today's Date: 02/12/2021    History of Present Illness 70yo female admitted 02/10/21 after a fall at home. Received percutaneous pinning R hip 8/29, may also potentially receive kyphoplasty for acute L1 compression fracture as well. PMH MS   Clinical Impression   Carrie Martinez was evaluated s/p the above fall and fractures. PTA pt was mod I with ADLs, does not drive and her son assists with IADLs as needed. She lives alone in a 1 level home with 1 STE, he son lives in [place] and has support from her neighbors and friends. Upon evaluation pt was max A +2 for bed mobility and mod A +2 for sit<>stand with use of steady frame. Pt tolerated standing for 7-8 minutes with verbal and tactile cues for weight bearing. Currently she is up to max A +2 for ADLs. Pt continues to benefit from OT acutly/. Recommend d/c to CIR to progress towards mod I baseline.     Follow Up Recommendations  CIR    Equipment Recommendations  3 in 1 bedside commode;Wheelchair (measurements OT);Wheelchair cushion (measurements OT);Hospital bed    Recommendations for Other Services Rehab consult     Precautions / Restrictions Precautions Precautions: Fall;Other (comment) Precaution Comments: hx progressive MS, L1 compression fx pending possible kyphoplasty (use back precautions) Restrictions Weight Bearing Restrictions: Yes RLE Weight Bearing: Partial weight bearing RLE Partial Weight Bearing Percentage or Pounds: 25-50%      Mobility Bed Mobility Overal bed mobility: Needs Assistance Bed Mobility: Rolling;Sidelying to Sit;Sit to Sidelying Rolling: Max assist Sidelying to sit: Max assist;+2 for physical assistance Supine to sit: Max assist Sit to supine: Max assist Sit to sidelying: Max assist;+2 for physical assistance General bed mobility comments: able to tolerate rolling better today with use of rail but still  painful- still requires heavy assist to go from side to sit due to pain and weakness with strong posterior lean once at EOB    Transfers Overall transfer level: Needs assistance Equipment used: Ambulation equipment used Transfers: Sit to/from Stand Sit to Stand: Mod assist;+2 physical assistance         General transfer comment: ModAx2 to boost to standing in stedy- max cues for technique to shift weight over to L LE/maintain PWB precautions R LE but once up able to maintain precautions well. Tolerated standing in stedy about 7-8 minutes    Balance Overall balance assessment: Needs assistance;History of Falls Sitting-balance support: Bilateral upper extremity supported;Feet supported Sitting balance-Leahy Scale: Poor Sitting balance - Comments: strong posterior lean Postural control: Posterior lean Standing balance support: Bilateral upper extremity supported;During functional activity Standing balance-Leahy Scale: Poor Standing balance comment: posterior lean even with BUE support                           ADL either performed or assessed with clinical judgement   ADL Overall ADL's : Needs assistance/impaired Eating/Feeding: NPO   Grooming: Set up;Sitting   Upper Body Bathing: Set up;Sitting   Lower Body Bathing: Maximal assistance;+2 for physical assistance;+2 for safety/equipment;Sit to/from stand Lower Body Bathing Details (indicate cue type and reason): standing wtih steady frame Upper Body Dressing : Set up;Sitting   Lower Body Dressing: Maximal assistance;+2 for physical assistance;+2 for safety/equipment;Sit to/from stand Lower Body Dressing Details (indicate cue type and reason): standing with steady frame Toilet Transfer: Maximal assistance;+2 for physical assistance;+2 for safety/equipment;Stand-pivot;BSC;RW   Toileting- Clothing Manipulation and Hygiene: Maximal  assistance;Sit to/from stand       Functional mobility during ADLs: Maximal  assistance;+2 for physical assistance;+2 for safety/equipment (mod A +2 for sit<>stand only, max A +2 to progress mobility) General ADL Comments: assist for lower body ADLs due to generalized weakness, pain and WB status     Vision         Perception     Praxis      Pertinent Vitals/Pain Pain Assessment: Faces Faces Pain Scale: Hurts whole lot Pain Location: low back and R LE Pain Descriptors / Indicators: Numbness;Sharp;Discomfort;Aching Pain Intervention(s): Limited activity within patient's tolerance;Monitored during session     Hand Dominance     Extremity/Trunk Assessment Upper Extremity Assessment Upper Extremity Assessment: Generalized weakness;RUE deficits/detail RUE Deficits / Details: Pt reports decrased fine motor skills and coorination of her right hand. She is able to use utensils for self feeding however is unable to write. RUE Sensation: WNL RUE Coordination: decreased fine motor   Lower Extremity Assessment Lower Extremity Assessment: Defer to PT evaluation   Cervical / Trunk Assessment Cervical / Trunk Assessment: Kyphotic   Communication Communication Communication: No difficulties   Cognition Arousal/Alertness: Awake/alert Behavior During Therapy: WFL for tasks assessed/performed;Anxious Overall Cognitive Status: Within Functional Limits for tasks assessed                                 General Comments: WFL and appropriate, just poor safety awareness/problem solving at baseline   General Comments  VSS on RA, orthostatics negative this session. pt did report dizziness with position changes as well as with head turning. pt reported she has a lot of dizziness at baseline adn attributes it to her MS. Might be worth having a vertibular eval this admission.    Exercises     Shoulder Instructions      Home Living Family/patient expects to be discharged to:: Private residence Living Arrangements: Alone Available Help at Discharge:  Family;Other (Comment);Available PRN/intermittently Type of Home: House Home Access: Stairs to enter [employer] of Steps: 1 STE with U grab bar always has someone to help with the step Entrance Stairs-Rails: Left Home Layout: One level     Bathroom Shower/Tub: Producer, television/film/video: Handicapped height Bathroom Accessibility: Yes   Home Equipment: Shower seat;Grab bars - tub/shower;Hand held shower head;Toilet riser;Walker - 4 wheels;Walker - 2 wheels   Additional Comments: hx of MS      Prior Functioning/Environment Level of Independence: Needs assistance  Gait / Transfers Assistance Needed: able to walk around in home with walkers, does consistently need help with steps ADL's / Homemaking Assistance Needed: indepedent with bathing/dressing with adaptive equipment/DME   Comments: does not drive, is mostly in the house; son does most of bills, pt only does some; pt manages meds        OT Problem List: Decreased strength;Decreased range of motion;Decreased activity tolerance;Impaired balance (sitting and/or standing);Decreased coordination;Decreased safety awareness;Decreased knowledge of use of DME or AE;Decreased knowledge of precautions;Pain      OT Treatment/Interventions: Self-care/ADL training;Therapeutic exercise;DME and/or AE instruction;Therapeutic activities;Balance training;Patient/family education    OT Goals(Current goals can be found in the care plan section) Acute Rehab OT Goals Patient Stated Goal: less pain OT Goal Formulation: With patient Time For Goal Achievement: 02/26/21 Potential to Achieve Goals: Fair ADL Goals Pt Will Perform Lower Body Bathing: with min assist;with adaptive equipment;sit to/from stand Pt Will Perform Lower Body Dressing: with min assist;sit  to/from stand Pt Will Transfer to Toilet: with min assist;stand pivot transfer;bedside commode Pt Will Perform Toileting - Clothing Manipulation and hygiene: with min guard  assist;sitting/lateral leans  OT Frequency: Min 2X/week   Barriers to D/C: Decreased caregiver support  pt lives alone       Co-evaluation   Reason for Co-Treatment: Complexity of the patient's impairments (multi-system involvement);For patient/therapist safety;To address functional/ADL transfers PT goals addressed during session: Mobility/safety with mobility;Balance;Proper use of DME        AM-PAC OT "6 Clicks" Daily Activity     Outcome Measure Help from another person eating meals?: A Little Help from another person taking care of personal grooming?: A Little Help from another person toileting, which includes using toliet, bedpan, or urinal?: A Lot Help from another person bathing (including washing, rinsing, drying)?: A Lot Help from another person to put on and taking off regular upper body clothing?: None Help from another person to put on and taking off regular lower body clothing?: A Lot 6 Click Score: 16   End of Session Equipment Utilized During Treatment: Gait belt (steady) Nurse Communication: Mobility status;Need for lift equipment (use of steady with WB status)  Activity Tolerance: Patient tolerated treatment well;Patient limited by pain Patient left: in bed;with call bell/phone within reach;with bed alarm set  OT Visit Diagnosis: Unsteadiness on feet (R26.81);Repeated falls (R29.6);Muscle weakness (generalized) (M62.81);History of falling (Z91.81);Pain                Time: 1005-1035 OT Time Calculation (min): 30 min Charges:  OT General Charges $OT Visit: 1 Visit OT Evaluation $OT Eval Moderate Complexity: 1 Mod OT Treatments $Self Care/Home Management : 23-37 mins    Kenry Daubert A Pharrell Ledford 02/12/2021, 11:28 AM

## 2021-02-12 NOTE — Progress Notes (Signed)
No change in the patient's clinical exam.  Continues to have significant upper lumbar pain with direct palpation.  Grossly neurologically intact.  Plan on moving forward with an L1 kyphoplasty as discussed.

## 2021-02-12 NOTE — Anesthesia Postprocedure Evaluation (Signed)
Anesthesia Post Note  Patient: Carrie Martinez  Procedure(s) Performed: LUMBAR ONE KYPHOPLASTY (Spine Lumbar)     Patient location during evaluation: PACU Anesthesia Type: MAC Level of consciousness: awake and alert Pain management: pain level controlled Vital Signs Assessment: post-procedure vital signs reviewed and stable Respiratory status: spontaneous breathing and respiratory function stable Cardiovascular status: stable Postop Assessment: no apparent nausea or vomiting Anesthetic complications: no   No notable events documented.  Last Vitals:  Vitals:   02/12/21 1436 02/12/21 1451  BP: (!) 108/49 (!) 108/49  Pulse: (!) 52 (!) 55  Resp: 17 17  Temp:    SpO2: 93% 94%    Last Pain:  Vitals:   02/12/21 1451  TempSrc:   PainSc: Asleep    LLE Motor Response: Purposeful movement (02/12/21 1451) LLE Sensation: Full sensation (02/12/21 1451) RLE Motor Response: Purposeful movement (02/12/21 1451) RLE Sensation: Full sensation (02/12/21 1451)      Ellina Sivertsen DANIEL

## 2021-02-12 NOTE — Op Note (Signed)
OPERATIVE REPORT  DATE OF SURGERY: 02/12/2021  PATIENT NAME:  Carrie Martinez MRN: [id number] DOB: 1951/04/28  PCP: Marlyn Corporal, PA  PRE-OPERATIVE DIAGNOSIS: L1 compression fracture.  Posttraumatic  POST-OPERATIVE DIAGNOSIS: Same  PROCEDURE:   L1 kyphoplasty  SURGEON:  Venita Lick, MD  PHYSICIAN ASSISTANT: None  ANESTHESIA:   IV sedation with local anesthesia  EBL: 0   Complications: None BRIEF HISTORY: Carrie Martinez is a 70 y.o. female who had a mechanical fall and suffered a right hip fracture.  Patient also was noted to have significant lumbar pain.  Imaging studies demonstrated an acute L1 compression fracture as well as an old chronic T11 fracture.  Patient subsequently underwent ORIF of the hip but continues to have severe debilitating back pain.  CT scan confirmed a fracture L1, and as result we elected to move forward with surgery.  All appropriate risks benefits and alternatives were discussed with patient and consent was obtained  PROCEDURE DETAILS: Patient was brought into the operating room and was properly positioned on the operating room table, and teds and SCDs were applied.  A timeout was taken to confirm all important data: Including patient, procedure, and the level.  IV sedation was provided by the anesthesiologist and the back was prepped and draped.  Using fluoroscopy identified the L1 vertebral level.  I marked out the incision site which was just lateral to the pedicle I infiltrated the area with quarter percent Marcaine with epinephrine.  I then advanced a spinal needle down to the lateral aspect of the facet complex and then injected the periosteum and deep musculature with quarter percent Marcaine with Exparel.  This was done on both sides.  2 small stab incisions were made and the Jamshidi needles were advanced percutaneously to the lateral aspect of the L1 facet near the pedicle.  Using biplane fluoroscopic images I advanced the Jamshidi needles into  the pedicle of L1.  As I neared the medial wall of the pedicle on the AP view I confirmed satisfactory trajectory on the lateral view and that I was just beyond the posterior wall the vertebral body.  Once I confirm my trajectory I advanced into the vertebral body I then used the drill to create my bony canal.  I then sounded the canal to ensure that it was a solid bony canal.  The inflatable bone tamps were then inserted and I inserted approximately 3 cc of cement on each side.  I confirmed satisfactory insufflation in the AP and lateral fluoroscopic planes.  The balloons were deflated and the cement was inserted.  I inserted a total of approximately 6 cc of cement.  Imaging studies were performed throughout the insertion of the cement confirming satisfactory positioning with no leak.  The cement was allowed to harden and the trochars were removed.  Final x-rays demonstrate satisfactory positioning of the cement mantle with no leak.  The skin was then cleaned and the stab incisions were closed with interrupted 3-0 Monocryl sutures.  A bond and a Band-Aid were then applied.  The patient was then transferred to the PACU without incident.  The end of the case all needle and sponge counts were correct.  There are no adverse intraoperative events.  Venita Lick, MD 02/12/2021 1:42 PM

## 2021-02-12 NOTE — Anesthesia Preprocedure Evaluation (Addendum)
Anesthesia Evaluation  Patient identified by MRN, date of birth, ID band Patient awake    Reviewed: Allergy & Precautions, NPO status , Patient's Chart, lab work & pertinent test results  History of Anesthesia Complications Negative for: history of anesthetic complications  Airway Mallampati: II  TM Distance: >3 FB Neck ROM: Full    Dental no notable dental hx. (+) Dental Advisory Given   Pulmonary Current Smoker and Patient abstained from smoking.,    Pulmonary exam normal        Cardiovascular negative cardio ROS Normal cardiovascular exam     Neuro/Psych MS  Neuromuscular disease (MS)    GI/Hepatic negative GI ROS, Neg liver ROS,   Endo/Other  negative endocrine ROS  Renal/GU negative Renal ROS     Musculoskeletal   Abdominal   Peds  Hematology negative hematology ROS (+)   Anesthesia Other Findings   Reproductive/Obstetrics                            Lab Results  Component Value Date   WBC 8.8 02/12/2021   HGB 12.0 02/12/2021   HCT 35.4 (L) 02/12/2021   MCV 98.3 02/12/2021   PLT 156 02/12/2021   Lab Results  Component Value Date   CREATININE 0.52 02/11/2021   BUN 6 (L) 02/11/2021   NA 136 02/11/2021   K 3.8 02/11/2021   CL 103 02/11/2021   CO2 22 02/11/2021    Anesthesia Physical  Anesthesia Plan  ASA: 3  Anesthesia Plan: MAC   Post-op Pain Management:    Induction:   PONV Risk Score and Plan: 2 and Dexamethasone, Ondansetron and Treatment may vary due to age or medical condition  Airway Management Planned: Natural Airway  Additional Equipment: None  Intra-op Plan:   Post-operative Plan:   Informed Consent: I have reviewed the patients History and Physical, chart, labs and discussed the procedure including the risks, benefits and alternatives for the proposed anesthesia with the patient or authorized representative who has indicated his/her understanding  and acceptance.     Dental advisory given  Plan Discussed with: Anesthesiologist and CRNA  Anesthesia Plan Comments:        Anesthesia Quick Evaluation

## 2021-02-12 NOTE — Progress Notes (Signed)
PROGRESS NOTE    Carrie Martinez  AJO:[id number] DOB: 06-28-1950 DOA: 02/09/2021 PCP: Marlyn Corporal, PA   Brief Narrative: Carrie Martinez is a 69 y.o. female with a history of multiple sclerosis.  Patient presented secondary to fall and resultant right hip fracture and L1 compression fracture.  Patient uses a walker at baseline.  Orthopedic surgery was consulted and performed right percutaneous pinning.  Plan for L1 kyphoplasty.  PT/OT recommending SNF on discharge.   Assessment & Plan:   Active Problems:   Closed right hip fracture, initial encounter (HCC)   Closed compression fracture of body of L1 vertebra (HCC)   Multiple sclerosis (HCC)   Hip fracture (HCC)   S/P ORIF right hip (open reduction internal fixation) fracture   Right hip fracture Secondary to fall. Orthopedic surgery consulted and performed percutaneous pinning on 8/29. Orthopedic surgery recommendations for 25-50% right hip weightbearing with return to clinic in 2 weeks. PT/OT recommending SNF on discharge. Hemoglobin stable.  L1 compression fracture Secondary to fall. Orthopedic surgery recommending kyphoplasty which is planned for 8/31  Leukocytosis In setting of trauma. Quickly resolved.  Multiple sclerosis -Continue amantadine, Diroximel -Continue Cymbalta  Hyperlipidemia -Continue Crestor   DVT prophylaxis: SCDs Code Status:   Code Status: Full Code Family Communication: None at bedside Disposition Plan: Discharge to SNF pending orthopedic surgery recommendations/management   Consultants:  Orthopedic surgery  Procedures:  PERCUTANEOUS PINNING OF RIGHT HIP (02/10/2021)  Antimicrobials: None    Subjective: No significant issues overnight  Objective: Vitals:   02/12/21 0830 02/12/21 0845 02/12/21 0945 02/12/21 1106  BP:  (!) 112/41 (!) 112/53 (!) 119/42  Pulse:  60 (!) 59 (!) 58  Resp:  17 17 (!) 30  Temp:  97.8 F (36.6 C)  97.8 F (36.6 C)  TempSrc:  Oral  Oral  SpO2: 96%  96% 97% 94%  Weight:      Height:        Intake/Output Summary (Last 24 hours) at 02/12/2021 1211 Last data filed at 02/11/2021 1300 Gross per 24 hour  Intake 240 ml  Output --  Net 240 ml   Filed Weights   02/09/21 2114  Weight: 54.4 kg    Examination:  General exam: Appears calm and comfortable Respiratory system: Clear to auscultation. Respiratory effort normal. Cardiovascular system: S1 & S2 heard, Normal rate with regular rhythm. Gastrointestinal system: Abdomen is nondistended, soft and nontender. No organomegaly or masses felt. Normal bowel sounds heard. Central nervous system: Alert and oriented. No focal neurological deficits. Musculoskeletal: Some tenderness over lateral aspect of right upper thigh. Dressing intact and clean/dry Skin: No cyanosis. No rashes Psychiatry: Judgement and insight appear normal. Mood & affect appropriate.     Data Reviewed: I have personally reviewed following labs and imaging studies  CBC Lab Results  Component Value Date   WBC 8.8 02/12/2021   RBC 3.60 (L) 02/12/2021   HGB 12.0 02/12/2021   HCT 35.4 (L) 02/12/2021   MCV 98.3 02/12/2021   MCH 33.3 02/12/2021   PLT 156 02/12/2021   MCHC 33.9 02/12/2021   RDW 13.1 02/12/2021   LYMPHSABS 1.1 02/09/2021   MONOABS 1.3 (H) 02/09/2021   EOSABS 0.0 02/09/2021   BASOSABS 0.1 02/09/2021     Last metabolic panel Lab Results  Component Value Date   NA 136 02/11/2021   K 3.8 02/11/2021   CL 103 02/11/2021   CO2 22 02/11/2021   BUN 6 (L) 02/11/2021   CREATININE 0.52 02/11/2021  GLUCOSE 148 (H) 02/11/2021   GFRNONAA >60 02/11/2021   CALCIUM 8.8 (L) 02/11/2021   ANIONGAP 11 02/11/2021    CBG (last 3)  No results for input(s): GLUCAP in the last 72 hours.   GFR: Estimated Creatinine Clearance: 57 mL/min (by C-G formula based on SCr of 0.52 mg/dL).  Coagulation Profile: No results for input(s): INR, PROTIME in the last 168 hours.  Recent Results (from the past 240 hour(s))   Resp Panel by RT-PCR (Flu A&B, Covid) Nasopharyngeal Swab     Status: None   Collection Time: 02/10/21 12:03 AM   Specimen: Nasopharyngeal Swab; Nasopharyngeal(NP) swabs in vial transport medium  Result Value Ref Range Status   SARS Coronavirus 2 by RT PCR NEGATIVE NEGATIVE Final    Comment: (NOTE) SARS-CoV-2 target nucleic acids are NOT DETECTED.  The SARS-CoV-2 RNA is generally detectable in upper respiratory specimens during the acute phase of infection. The lowest concentration of SARS-CoV-2 viral copies this assay can detect is 138 copies/mL. A negative result does not preclude SARS-Cov-2 infection and should not be used as the sole basis for treatment or other patient management decisions. A negative result may occur with  improper specimen collection/handling, submission of specimen other than nasopharyngeal swab, presence of viral mutation(s) within the areas targeted by this assay, and inadequate number of viral copies(<138 copies/mL). A negative result must be combined with clinical observations, patient history, and epidemiological information. The expected result is Negative.  Fact Sheet for Patients:  BloggerCourse.com  Fact Sheet for Healthcare Providers:  SeriousBroker.it  This test is no t yet approved or cleared by the [place] FDA and  has been authorized for detection and/or diagnosis of SARS-CoV-2 by FDA under an Emergency Use Authorization (EUA). This EUA will remain  in effect (meaning this test can be used) for the duration of the COVID-19 declaration under Section 564(b)(1) of the Act, 21 U.S.C.section 360bbb-3(b)(1), unless the authorization is terminated  or revoked sooner.       Influenza A by PCR NEGATIVE NEGATIVE Final   Influenza B by PCR NEGATIVE NEGATIVE Final    Comment: (NOTE) The Xpert Xpress SARS-CoV-2/FLU/RSV plus assay is intended as an aid in the diagnosis of influenza from  Nasopharyngeal swab specimens and should not be used as a sole basis for treatment. Nasal washings and aspirates are unacceptable for Xpert Xpress SARS-CoV-2/FLU/RSV testing.  Fact Sheet for Patients: BloggerCourse.com  Fact Sheet for Healthcare Providers: SeriousBroker.it  This test is not yet approved or cleared by the [place] FDA and has been authorized for detection and/or diagnosis of SARS-CoV-2 by FDA under an Emergency Use Authorization (EUA). This EUA will remain in effect (meaning this test can be used) for the duration of the COVID-19 declaration under Section 564(b)(1) of the Act, 21 U.S.C. section 360bbb-3(b)(1), unless the authorization is terminated or revoked.  Performed at [facility], [address], [place], [place] [address]   Surgical pcr screen     Status: None   Collection Time: 02/10/21  6:16 PM   Specimen: Nasal Mucosa; Nasal Swab  Result Value Ref Range Status   MRSA, PCR NEGATIVE NEGATIVE Final   Staphylococcus aureus NEGATIVE NEGATIVE Final    Comment: (NOTE) The Xpert SA Assay (FDA approved for NASAL specimens in patients 22 years of age and older), is one component of a comprehensive surveillance program. It is not intended to diagnose infection nor to guide or monitor treatment. Performed at [facility], [address],  [place], [place] [address]         Radiology Studies: DG C-Arm 1-60 Min-No Report  Result Date: 02/10/2021 Fluoroscopy was utilized by the requesting physician.  No radiographic interpretation.   DG HIP OPERATIVE UNILAT WITH PELVIS RIGHT  Result Date: 02/10/2021 CLINICAL DATA:  Right hip fracture EXAM: OPERATIVE right HIP (WITH PELVIS IF PERFORMED) 2 VIEWS TECHNIQUE: Fluoroscopic spot image(s) were submitted for interpretation post-operatively. COMPARISON:  02/09/2021 FINDINGS: Two low resolution intraoperative spot views of the right hip. Total  fluoroscopy time was 1 minutes 38 seconds. The images demonstrate threaded screw fixation of right femoral neck fracture. IMPRESSION: Intraoperative fluoroscopic assistance provided during surgical fixation of right hip fracture Electronically Signed   By: Jasmine Pang M.D.   On: 02/10/2021 20:17        Scheduled Meds:  [MAR Hold] amantadine  200 mg Oral Daily   [MAR Hold] aspirin  81 mg Oral BID WC   [MAR Hold] calcium-vitamin D  1 tablet Oral Q breakfast   [MAR Hold] Diroximel Fumarate  462 mg Oral BID   [MAR Hold] docusate sodium  100 mg Oral BID   [MAR Hold] DULoxetine  60 mg Oral Daily   [MAR Hold] feeding supplement  237 mL Oral BID BM   [MAR Hold] ferrous sulfate  325 mg Oral TID PC   [MAR Hold] multivitamin with minerals  1 tablet Oral Daily   [MAR Hold] polyethylene glycol  17 g Oral Daily   [MAR Hold] rosuvastatin  5 mg Oral Daily   [MAR Hold] senna  1 tablet Oral QHS   [MAR Hold] vitamin B-12  100 mcg Oral Daily   Continuous Infusions:  sodium chloride Stopped (02/12/21 1006)   [MAR Hold]  ceFAZolin (ANCEF) IV     lactated ringers 85 mL/hr at 02/12/21 1009   lactated ringers 10 mL/hr at 02/12/21 1115   methocarbamol (ROBAXIN) IV       LOS: 2 days     Jacquelin Hawking, MD [facility] 02/12/2021, 12:11 PM  If 7PM-7AM, please contact night-coverage [email or web address]

## 2021-02-13 ENCOUNTER — Encounter (HOSPITAL_COMMUNITY): Payer: Self-pay | Admitting: Orthopedic Surgery

## 2021-02-13 MED ORDER — METHOCARBAMOL 500 MG PO TABS: 500.0000 mg | ORAL_TABLET | Freq: Three times a day (TID) | ORAL | 0 refills | Status: AC | PRN

## 2021-02-13 MED ORDER — ONDANSETRON HCL 4 MG PO TABS: 4.0000 mg | ORAL_TABLET | Freq: Three times a day (TID) | ORAL | 0 refills | Status: AC | PRN

## 2021-02-13 MED ORDER — ASPIRIN 81 MG PO CHEW: 81.0000 mg | CHEWABLE_TABLET | Freq: Two times a day (BID) | ORAL | 0 refills | Status: AC

## 2021-02-13 MED ORDER — HYDROCODONE-ACETAMINOPHEN 5-325 MG PO TABS: 1.0000 | ORAL_TABLET | Freq: Four times a day (QID) | ORAL | 0 refills | Status: AC | PRN

## 2021-02-13 NOTE — Progress Notes (Signed)
Physical Therapy Treatment Patient Details Name: Carrie Martinez MRN: [id number] DOB: 11/12/1950 Today's Date: 02/13/2021    History of Present Illness 70 yo female admitted 02/10/21 after a fall at home. Received percutaneous pinning R hip 8/29, then received kyphoplasty for acute L1 compression fracture on 02/12/21.   PMH MS    PT Comments    Pt was seen for mobility on RW with assistance to get to chair after work on sitting balance control. Pt is unable to tolerate much use of RLE so was able to maintain WB on RLE as ordered.  Pt will follow up with PT for further work on steps and balance, to increase safety and independence with gait and transfers.  Follow for goals of acute PT.  Continue to remind pt of her precautions and limits.  Instructed staff to assist her with return to bed in an hour after her time up.   Follow Up Recommendations  SNF;Supervision/Assistance - 24 hour     Equipment Recommendations  Rolling walker with 5" wheels;3in1 (PT);Wheelchair (measurements PT);Wheelchair cushion (measurements PT)    Recommendations for Other Services       Precautions / Restrictions Precautions Precautions: Fall Precaution Comments: Hx MS, new L1 kyphoplasty Required Braces or Orthoses:  (no brace) Restrictions Weight Bearing Restrictions: Yes RLE Weight Bearing: Partial weight bearing RLE Partial Weight Bearing Percentage or Pounds: 25-50% Other Position/Activity Restrictions: back precautions    Mobility  Bed Mobility Overal bed mobility: Needs Assistance Bed Mobility: Sidelying to Sit;Rolling Rolling: Max assist Sidelying to sit: Max assist       General bed mobility comments: rolls with help to maintain RLE posture with movement    Transfers Overall transfer level: Needs assistance Equipment used: Rolling walker (2 wheeled);1 person hand held assist Transfers: Sit to/from Stand Sit to Stand: Mod assist;From elevated surface         General transfer comment:  stood up with maintenance of WB precautions on R hip  Ambulation/Gait Ambulation/Gait assistance: Mod assist;+2 physical assistance;+2 safety/equipment Gait Distance (Feet): 5 Feet Assistive device: Rolling walker (2 wheeled);2 person hand held assist Gait Pattern/deviations: Step-to pattern;Decreased stride length;Decreased weight shift to right;Shuffle;Trunk flexed;Wide base of support Gait velocity: reduced       Stairs             Wheelchair Mobility    Modified Rankin (Stroke Patients Only)       Balance Overall balance assessment: Needs assistance;History of Falls Sitting-balance support: Feet supported;Single extremity supported Sitting balance-Leahy Scale: Poor   Postural control: Posterior lean Standing balance support: Bilateral upper extremity supported;During functional activity Standing balance-Leahy Scale: Poor                              Cognition Arousal/Alertness: Awake/alert Behavior During Therapy: WFL for tasks assessed/performed Overall Cognitive Status: Within Functional Limits for tasks assessed                                 General Comments: motor planning skills are an issue, but can maintain WB limits with cues      Exercises      General Comments General comments (skin integrity, edema, etc.): pt was seen for mobility on RW with two person help and maintained wb on RLE the entire time      Pertinent Vitals/Pain Pain Assessment: Faces Faces Pain Scale: Hurts whole lot Pain  Location: RLE Pain Descriptors / Indicators: Aching;Operative site guarding;Grimacing Pain Intervention(s): Monitored during session;Repositioned    Home Living                      Prior Function            PT Goals (current goals can now be found in the care plan section) Acute Rehab PT Goals Patient Stated Goal: less pain PT Goal Formulation: With patient Progress towards PT goals: Progressing toward goals     Frequency    Min 3X/week      PT Plan Current plan remains appropriate    Co-evaluation              AM-PAC PT "6 Clicks" Mobility   Outcome Measure  Help needed turning from your back to your side while in a flat bed without using bedrails?: A Lot Help needed moving from lying on your back to sitting on the side of a flat bed without using bedrails?: A Lot Help needed moving to and from a bed to a chair (including a wheelchair)?: Total Help needed standing up from a chair using your arms (e.g., wheelchair or bedside chair)?: Total Help needed to walk in hospital room?: Total Help needed climbing 3-5 steps with a railing? : Total 6 Click Score: 8    End of Session Equipment Utilized During Treatment: Gait belt Activity Tolerance: Patient tolerated treatment well Patient left: in chair;with call bell/phone within reach;with chair alarm set Nurse Communication: Mobility status;Precautions PT Visit Diagnosis: Unsteadiness on feet (R26.81);Muscle weakness (generalized) (M62.81);Pain;Difficulty in walking, not elsewhere classified (R26.2) Pain - Right/Left: Right Pain - part of body: Hip     Time: 7169-6789 PT Time Calculation (min) (ACUTE ONLY): 38 min  Charges:  $Gait Training: 8-22 mins $Therapeutic Activity: 23-37 mins             Ivar Drape 02/13/2021, 4:17 PM  Samul Dada, PT MS Acute Rehab Dept. Number: [facility] [id number] and [facility] [phone]

## 2021-02-13 NOTE — Progress Notes (Signed)
PT Cancellation Note  Patient Details Name: Carrie Martinez MRN: [id number] DOB: 11/07/1950   Cancelled Treatment:    Reason Eval/Treat Not Completed: Medical issues which prohibited therapy.  Bedrest order in place, will await guidance on this.   Ivar Drape 02/13/2021, 12:27 PM  Samul Dada, PT MS Acute Rehab Dept. Number: [facility] [id number] and [facility] [phone]

## 2021-02-13 NOTE — Progress Notes (Signed)
Inpatient Rehab Admissions Coordinator:   Per OT recommendations, pt was screened for candidacy by Estill Dooms, PT, DPT.  At this time, pt does not appear to meet criteria for an inpatient rehab admission.  With low tolerance for mobility, weight bearing restrictions, and limited support at discharge, she will likely need a slower paced rehab.  We will not place a consult order at this time.  Recommend other rehab venues be pursued.    Estill Dooms, PT, DPT Admissions Coordinator [phone] 02/13/21  11:00 AM

## 2021-02-13 NOTE — Progress Notes (Addendum)
Subjective: 1 Day Post-Op Procedure(s) (LRB): LUMBAR ONE KYPHOPLASTY (N/A) 3 days post-op right hip percutaneous pinning Back pain improved. Mild right leg pain, she does note she has some right leg pain prior to hip injury due to her MS.  She is tolerating PO without any N/V Denies sweats/chills.   Objective: Vital signs in last 24 hours: Temp:  [97.8 F (36.6 C)-98.5 F (36.9 C)] 97.8 F (36.6 C) (09/01 0748) Pulse Rate:  [52-92] 92 (09/01 0748) Resp:  [17-30] 18 (09/01 0748) BP: (99-120)/(42-53) 111/51 (09/01 0748) SpO2:  [92 %-98 %] 92 % (09/01 0748)  Intake/Output from previous day: 08/31 0701 - 09/01 0700 In: 600 [I.V.:500; IV Piggyback:100] Out: 5 [Blood:5] Intake/Output this shift: Total I/O In: -  Out: 250 [Urine:250]  Recent Labs    02/11/21 0255 02/12/21 0055  HGB 12.4 12.0   Recent Labs    02/11/21 0255 02/12/21 0055  WBC 9.1 8.8  RBC 3.76* 3.60*  HCT 37.4 35.4*  PLT 161 156   Recent Labs    02/11/21 0255  NA 136  K 3.8  CL 103  CO2 22  BUN 6*  CREATININE 0.52  GLUCOSE 148*  CALCIUM 8.8*   No results for input(s): LABPT, INR in the last 72 hours.  AAOx3, NAD Hip dressing C/D/I no drainage, Lumbar dressings C/D/I no drainage Distal sensation intact, slightly decreased on right compared to left-- patient reports this is baseline due to MS Left dorsiflexion/planterflexion in tact, right difficult, patient states this is baseline due to MS. She can wiggle toes. Palpable DP pulses, good cap refill bilaterally. Compartments safe and non-tender.   Assessment/Plan: 1 Day Post-Op Procedure(s) (LRB): LUMBAR ONE KYPHOPLASTY (N/A) 3 days post-op right hip percutaneous pinning Advance diet Up with therapy No PT restrictions in regards to Lumbar spine. Hip restrictions per Dr. Charlann Boxer  Patient will F/u with Dr. Shon Baton or I in 2 weeks in the office  Addendum: Patient will start 81 mg BID on 9/2. This is the DVT ppx recommended by Dr. Charlann Boxer for the  hip surgery. Dr. Shon Baton would like to hold ASA until patient is 2 days post-op from kypho due to risk of spinal hematoma. This order is in.      Rhodia Albright 02/13/2021, 10:57 AM

## 2021-02-13 NOTE — TOC Initial Note (Signed)
Transition of Care [facility]) - Initial/Assessment Note    Patient Details  Name: Carrie Martinez MRN: [id number] Date of Birth: Dec 07, 1950  [facility] CM/SW Contact:    Ralene Bathe, LCSWA Phone Number: 02/13/2021, 11:55 AM  Clinical Narrative:                 CSW received consult for possible SNF placement at time of discharge. CSW spoke with patient, patient's son, and patient's daughter in law at bedside.  The family expressed understanding of PT recommendation and is agreeable to SNF placement at time of discharge. The family reports that the patient is from home alone, but if assistance is needed after rehab, they are willing to have patient live with them.  The family reports preference for a facility in [place] near the hospital. CSW discussed insurance authorization process and will provide Medicare SNF ratings list. Patient has received  2 COVID vaccines and is open to receiving a booster while admitted.   Skilled Nursing Rehab Facilities-   ShinProtection.co.uk Ratings out of 5 possible    Name Address  Phone # Quality Care Staffing Health Inspection Overall  [facility] [address], [place] [phone] 5 1 4 4   [facility]  [address], [place] [phone] 3 1 5 4   [facility] [address] [place], [address] [place] 3 1 1 1   [facility] [address] 2 2 4 4   [facility] [address] [phone] 3 1 2 1   [facility] [address] [place] [phone] 3 2 4 4   [facility] [address], [address] [place] 5 1 2 2   [facility] [address] [facility] [place] 5 2 2 3   [facility] [address], [facility] 5 1 2 2   [facility] [phone], [phone] 5 1 2 2   [facility] [address] [facility] [phone] 5 1 2 2    [facility]. [address] 3 1 1 1      Expected Discharge Plan: Skilled Nursing Facility Barriers to Discharge: Insurance Authorization, SNF Pending bed offer   Patient Goals and CMS Choice Patient states their goals for this hospitalization and ongoing recovery are:: To get back to living independently CMS Medicare.gov Compare Post Acute Care list provided to:: Patient Choice offered to / list presented to : Patient, Adult Children  Expected Discharge Plan and Services Expected Discharge Plan: Skilled Nursing Facility       Living arrangements for the past 2 months: Single Family Home                                      Prior Living Arrangements/Services Living arrangements for the past 2 months: Single Family Home Lives with:: Self Patient language and need for interpreter reviewed:: Yes Do you feel safe going back to the place where you live?: Yes      Need for Family Participation in Patient Care: Yes (Comment) Care giver support system in place?: Yes (comment)   Criminal Activity/Legal Involvement Pertinent to Current Situation/Hospitalization: No - Comment as needed  Activities of Daily Living Home Assistive Devices/Equipment: [facility] (specify type) ADL Screening (condition at time of admission) Patient's cognitive ability adequate to safely complete daily activities?: Yes Is the patient deaf or have difficulty hearing?: No Does the  patient have difficulty seeing, even when wearing glasses/contacts?: Yes Does the patient have difficulty concentrating, remembering, or making decisions?: No Patient able to express need for assistance with ADLs?: Yes Does the patient have difficulty dressing or bathing?: No Independently performs ADLs?: Yes (appropriate for developmental age) Does the patient have difficulty walking or climbing stairs?: Yes Weakness of Legs: Right Weakness of Arms/Hands: None  Permission  Sought/Granted Permission sought to share information with : Family Supports Permission granted to share information with : Yes, Verbal Permission Granted     Permission granted to share info w AGENCY: SNF        Emotional Assessment Appearance:: Appears older than stated age Attitude/Demeanor/Rapport: Engaged Affect (typically observed): Accepting, Pleasant, Hopeful Orientation: : Oriented to Self, Oriented to Place, Oriented to  Time, Oriented to Situation Alcohol / Substance Use: Not Applicable Psych Involvement: No (comment)  Admission diagnosis:  Hip fracture (HCC) [S72.009A] Closed fracture of neck of right femur, initial encounter (HCC) [S72.001A] Fall, initial encounter [W19.XXXA] Closed right hip fracture, initial encounter (HCC) [S72.001A] Closed fracture of first lumbar vertebra, unspecified fracture morphology, initial encounter (HCC) [S32.019A] Lumbar compression fracture (HCC) [S32.000A] Patient Active Problem List   Diagnosis Date Noted   Lumbar compression fracture (HCC) 02/12/2021   Closed right hip fracture, initial encounter (HCC) 02/10/2021   Closed compression fracture of body of L1 vertebra (HCC) 02/10/2021   Multiple sclerosis (HCC) 02/10/2021   Hip fracture (HCC) 02/10/2021   S/P ORIF right hip (open reduction internal fixation) fracture 02/10/2021   PCP:  Marlyn Corporal, PA Pharmacy:   [address] - [place], [place] - [address] [place] [address] Phone: [phone] Fax: [phone]     Social Determinants of Health (SDOH) Interventions    Readmission Risk Interventions No flowsheet data found.

## 2021-02-13 NOTE — Care Management Important Message (Signed)
Important Message  Patient Details  Name: Carrie Martinez MRN: [id number] Date of Birth: 03/31/1951   Medicare Important Message Given:  Yes     Katanya Schlie [place] 02/13/2021, 3:03 PM

## 2021-02-13 NOTE — NC FL2 (Signed)
[place] MEDICAID FL2 LEVEL OF CARE SCREENING TOOL     IDENTIFICATION  Patient Name: Carrie Martinez Birthdate: 1950/08/26 Sex: female Admission Date (Current Location): 02/09/2021  [facility] and [place] Number:  Producer, television/film/video and Address:  [facility], [address] [place], [place] [address]      Provider Number: [id number]  Attending Physician Name and Address:  Narda Bonds, MD  Relative Name and Phone Number:  Caver,Chris Shari Heritage)   [phone]    Current Level of Care: Hospital Recommended Level of Care: Skilled Nursing Facility Prior Approval Number:    Date Approved/Denied:   PASRR Number: [id number]  Discharge Plan: SNF    Current Diagnoses: Patient Active Problem List   Diagnosis Date Noted   Lumbar compression fracture (HCC) 02/12/2021   Closed right hip fracture, initial encounter (HCC) 02/10/2021   Closed compression fracture of body of L1 vertebra (HCC) 02/10/2021   Multiple sclerosis (HCC) 02/10/2021   Hip fracture (HCC) 02/10/2021   S/P ORIF right hip (open reduction internal fixation) fracture 02/10/2021    Orientation RESPIRATION BLADDER Height & Weight     Time, Self, Situation, Place  Normal Continent Weight: 120 lb (54.4 kg) Height:  5\' 5"  (165.1 cm)  BEHAVIORAL SYMPTOMS/MOOD NEUROLOGICAL BOWEL NUTRITION STATUS      Continent Diet (refer to d/c summary)  AMBULATORY STATUS COMMUNICATION OF NEEDS Skin   Extensive Assist Verbally Surgical wounds (s/p L1 kyphoplasty, 8/31)                       Personal Care Assistance Level of Assistance  Bathing, Feeding, Dressing Bathing Assistance: Maximum assistance Feeding assistance: Independent Dressing Assistance: Maximum assistance     Functional Limitations Info  Sight, Hearing, Speech Sight Info: Adequate Hearing Info: Adequate Speech Info: Adequate    SPECIAL CARE FACTORS FREQUENCY  PT (By licensed PT), OT (By licensed OT)     PT Frequency:  5X/week, evaluate and treat OT Frequency: 5X/week, evaluate and treat            Contractures Contractures Info: Not present    Additional Factors Info  Code Status, Allergies Code Status Info: Full code Allergies Info: NKA           Current Medications (02/13/2021):  This is the current hospital active medication list Current Facility-Administered Medications  Medication Dose Route Frequency Provider Last Rate Last Admin   0.9 %  sodium chloride infusion  250 mL Intravenous Continuous 04/15/2021, PA-C       acetaminophen (TYLENOL) tablet 325-650 mg  325-650 mg Oral Q6H PRN Rhodia Albright, PA-C       amantadine (SYMMETREL) capsule 200 mg  200 mg Oral Daily Cassandria Anger, MD   200 mg at 02/13/21 0916   [START ON 02/14/2021] aspirin chewable tablet 81 mg  81 mg Oral BID WC 04/16/2021, MD       bisacodyl (DULCOLAX) suppository 10 mg  10 mg Rectal Daily PRN Venita Lick, MD       calcium-vitamin D (OSCAL WITH D) 500-200 MG-UNIT per tablet 1 tablet  1 tablet Oral Q breakfast Venita Lick, MD   1 tablet at 02/13/21 0853   diphenhydrAMINE (BENADRYL) 12.5 MG/5ML elixir 12.5-25 mg  12.5-25 mg Oral Q4H PRN 07-15-1984, MD       Diroximel Fumarate CPDR 462 mg  462 mg Oral BID Venita Lick, MD   462 mg at 02/13/21 0936   docusate  sodium (COLACE) capsule 100 mg  100 mg Oral BID Venita Lick, MD   100 mg at 02/13/21 0913   DULoxetine (CYMBALTA) DR capsule 60 mg  60 mg Oral Daily Venita Lick, MD   60 mg at 02/13/21 0913   feeding supplement (ENSURE ENLIVE / ENSURE PLUS) liquid 237 mL  237 mL Oral BID BM Venita Lick, MD   237 mL at 02/13/21 0936   ferrous sulfate tablet 325 mg  325 mg Oral TID Durenda Hurt, MD   325 mg at 02/13/21 1208   gabapentin (NEURONTIN) capsule 200-300 mg  200-300 mg Oral QHS PRN Venita Lick, MD       HYDROcodone-acetaminophen [facility]) 7.5-325 MG per tablet 1-2 tablet  1-2 tablet Oral Q4H PRN Venita Lick, MD   2 tablet at 02/12/21 2324    HYDROcodone-acetaminophen (NORCO/VICODIN) 5-325 MG per tablet 1-2 tablet  1-2 tablet Oral Q4H PRN Venita Lick, MD   2 tablet at 02/13/21 1207   lactated ringers infusion   Intravenous Continuous Rhodia Albright, PA-C 85 mL/hr at 02/12/21 1618 New Bag at 02/12/21 1618   menthol-cetylpyridinium (CEPACOL) lozenge 3 mg  1 lozenge Oral PRN Venita Lick, MD       Or   phenol (CHLORASEPTIC) mouth spray 1 spray  1 spray Mouth/Throat PRN Venita Lick, MD       methocarbamol (ROBAXIN) tablet 500 mg  500 mg Oral Q6H PRN Cassandria Anger, PA-C   500 mg at 02/10/21 2109   Or   methocarbamol (ROBAXIN) 500 mg in dextrose 5 % 50 mL IVPB  500 mg Intravenous Q6H PRN Cassandria Anger, PA-C       metoCLOPramide (REGLAN) tablet 5-10 mg  5-10 mg Oral Q8H PRN Cassandria Anger, PA-C       Or   metoCLOPramide (REGLAN) injection 5-10 mg  5-10 mg Intravenous Q8H PRN Cassandria Anger, PA-C       multivitamin with minerals tablet 1 tablet  1 tablet Oral Daily Venita Lick, MD   1 tablet at 02/13/21 0913   ondansetron (ZOFRAN) tablet 4 mg  4 mg Oral Q6H PRN Cassandria Anger, PA-C       Or   ondansetron [facility]) injection 4 mg  4 mg Intravenous Q6H PRN Rosalene Billings R, PA-C       polyethylene glycol (MIRALAX / GLYCOLAX) packet 17 g  17 g Oral Daily Venita Lick, MD   17 g at 02/13/21 0914   rosuvastatin (CRESTOR) tablet 5 mg  5 mg Oral Daily Venita Lick, MD   5 mg at 02/13/21 0914   sodium chloride flush (NS) 0.9 % injection 3 mL  3 mL Intravenous Q12H Rhodia Albright, PA-C   3 mL at 02/13/21 0915   sodium chloride flush (NS) 0.9 % injection 3 mL  3 mL Intravenous PRN Rhodia Albright, PA-C       traZODone (DESYREL) tablet 50 mg  50 mg Oral QHS PRN Venita Lick, MD       vitamin B-12 (CYANOCOBALAMIN) tablet 100 mcg  100 mcg Oral Daily Venita Lick, MD   100 mcg at 02/13/21 0915     Discharge Medications: Please see discharge summary for a list of discharge medications.  Relevant Imaging  Results:  Relevant Lab Results:   Additional Information SSN:  [id number],  COVID x2 (Patient is willing to get booster prior to leaving the hospital)  Ralene Bathe, LCSWA

## 2021-02-13 NOTE — Anesthesia Postprocedure Evaluation (Signed)
Anesthesia Post Note  Patient: Carrie Martinez  Procedure(s) Performed: PERCUTANEOUS PINNING (Right: Hip)     Patient location during evaluation: PACU Anesthesia Type: General Level of consciousness: awake and alert Pain management: pain level controlled Vital Signs Assessment: post-procedure vital signs reviewed and stable Respiratory status: spontaneous breathing, nonlabored ventilation, respiratory function stable and patient connected to nasal cannula oxygen Cardiovascular status: blood pressure returned to baseline and stable Postop Assessment: no apparent nausea or vomiting Anesthetic complications: no   No notable events documented.  Last Vitals:  Vitals:   02/12/21 2000 02/13/21 0748  BP: (!) 99/52 (!) 111/51  Pulse: 68 92  Resp: 20 18  Temp: 36.9 C 36.6 C  SpO2:  92%    Last Pain:  Vitals:   02/13/21 0800  TempSrc:   PainSc: 0-No pain                 Chuong Casebeer S

## 2021-02-13 NOTE — Progress Notes (Signed)
PROGRESS NOTE    [name]  MVE:[id number] DOB: 12-30-1950 DOA: 02/09/2021 PCP: Marlyn Corporal, PA   Brief Narrative: Carrie Martinez is a 70 y.o. female with a history of multiple sclerosis.  Patient presented secondary to fall and resultant right hip fracture and L1 compression fracture.  Patient uses a walker at baseline.  Orthopedic surgery was consulted and performed right percutaneous pinning.  Plan for L1 kyphoplasty.  PT/OT recommending SNF on discharge.   Assessment & Plan:   Active Problems:   Closed right hip fracture, initial encounter (HCC)   Closed compression fracture of body of L1 vertebra (HCC)   Multiple sclerosis (HCC)   Hip fracture (HCC)   S/P ORIF right hip (open reduction internal fixation) fracture   Lumbar compression fracture (HCC)   Right hip fracture Secondary to fall. Orthopedic surgery consulted and performed percutaneous pinning on 8/29. Orthopedic surgery recommendations for 25-50% right hip weightbearing with return to clinic in 2 weeks. PT/OT recommending SNF on discharge. Hemoglobin stable.  L1 compression fracture Secondary to fall. Patient underwent successful kyphoplasty.  Leukocytosis In setting of trauma. Quickly resolved.  Multiple sclerosis -Continue amantadine, Diroximel -Continue Cymbalta  Hyperlipidemia -Continue Crestor   DVT prophylaxis: Per orthopedic surgery: SCDs, aspirin 81 mg BID Code Status:   Code Status: Full Code Family Communication: Son and daughter-in-law at bedside Disposition Plan: Discharge to SNF when bed is available. Medically stable for discharge   Consultants:  Orthopedic surgery  Procedures:  PERCUTANEOUS PINNING OF RIGHT HIP (02/10/2021) L1 KYPHOPLASTY (02/12/2021)  Antimicrobials: None    Subjective: No issues overnight. Some right leg pain today. Back pain is significantly improved.  Objective: Vitals:   02/12/21 1521 02/12/21 2000 02/13/21 0748 02/13/21 1114  BP: (!) 109/47 (!)  99/52 (!) 111/51 98/61  Pulse: (!) 55 68 92 93  Resp: 18 20 18 20   Temp: 98.2 F (36.8 C) 98.5 F (36.9 C) 97.8 F (36.6 C) 98.5 F (36.9 C)  TempSrc:  Oral Oral Oral  SpO2: 94%  92% 93%  Weight:      Height:        Intake/Output Summary (Last 24 hours) at 02/13/2021 1331 Last data filed at 02/13/2021 0745 Gross per 24 hour  Intake 500 ml  Output 255 ml  Net 245 ml    Filed Weights   02/09/21 2114  Weight: 54.4 kg    Examination:  General exam: Appears calm and comfortable Respiratory system: Clear to auscultation. Respiratory effort normal. Cardiovascular system: S1 & S2 heard, Normal rate with regular rhythm. Gastrointestinal system: Abdomen is nondistended, soft and nontender. No organomegaly or masses felt. Normal bowel sounds heard. Central nervous system: Alert and oriented. No focal neurological deficits. Musculoskeletal: Some tenderness over lateral aspect of right upper thigh. Dressing intact and clean/dry Skin: No cyanosis. No rashes Psychiatry: Judgement and insight appear normal. Mood & affect appropriate.     Data Reviewed: I have personally reviewed following labs and imaging studies  CBC Lab Results  Component Value Date   WBC 8.8 02/12/2021   RBC 3.60 (L) 02/12/2021   HGB 12.0 02/12/2021   HCT 35.4 (L) 02/12/2021   MCV 98.3 02/12/2021   MCH 33.3 02/12/2021   PLT 156 02/12/2021   MCHC 33.9 02/12/2021   RDW 13.1 02/12/2021   LYMPHSABS 1.1 02/09/2021   MONOABS 1.3 (H) 02/09/2021   EOSABS 0.0 02/09/2021   BASOSABS 0.1 02/09/2021     Last metabolic panel Lab Results  Component Value Date  NA 136 02/11/2021   K 3.8 02/11/2021   CL 103 02/11/2021   CO2 22 02/11/2021   BUN 6 (L) 02/11/2021   CREATININE 0.52 02/11/2021   GLUCOSE 148 (H) 02/11/2021   GFRNONAA >60 02/11/2021   CALCIUM 8.8 (L) 02/11/2021   ANIONGAP 11 02/11/2021    CBG (last 3)  No results for input(s): GLUCAP in the last 72 hours.   GFR: Estimated Creatinine Clearance:  57 mL/min (by C-G formula based on SCr of 0.52 mg/dL).  Coagulation Profile: No results for input(s): INR, PROTIME in the last 168 hours.  Recent Results (from the past 240 hour(s))  Resp Panel by RT-PCR (Flu A&B, Covid) Nasopharyngeal Swab     Status: None   Collection Time: 02/10/21 12:03 AM   Specimen: Nasopharyngeal Swab; Nasopharyngeal(NP) swabs in vial transport medium  Result Value Ref Range Status   SARS Coronavirus 2 by RT PCR NEGATIVE NEGATIVE Final    Comment: (NOTE) SARS-CoV-2 target nucleic acids are NOT DETECTED.  The SARS-CoV-2 RNA is generally detectable in upper respiratory specimens during the acute phase of infection. The lowest concentration of SARS-CoV-2 viral copies this assay can detect is 138 copies/mL. A negative result does not preclude SARS-Cov-2 infection and should not be used as the sole basis for treatment or other patient management decisions. A negative result may occur with  improper specimen collection/handling, submission of specimen other than nasopharyngeal swab, presence of viral mutation(s) within the areas targeted by this assay, and inadequate number of viral copies(<138 copies/mL). A negative result must be combined with clinical observations, patient history, and epidemiological information. The expected result is Negative.  Fact Sheet for Patients:  BloggerCourse.com  Fact Sheet for Healthcare Providers:  SeriousBroker.it  This test is no t yet approved or cleared by the [place] FDA and  has been authorized for detection and/or diagnosis of SARS-CoV-2 by FDA under an Emergency Use Authorization (EUA). This EUA will remain  in effect (meaning this test can be used) for the duration of the COVID-19 declaration under Section 564(b)(1) of the Act, 21 U.S.C.section 360bbb-3(b)(1), unless the authorization is terminated  or revoked sooner.       Influenza A by PCR NEGATIVE NEGATIVE  Final   Influenza B by PCR NEGATIVE NEGATIVE Final    Comment: (NOTE) The Xpert Xpress SARS-CoV-2/FLU/RSV plus assay is intended as an aid in the diagnosis of influenza from Nasopharyngeal swab specimens and should not be used as a sole basis for treatment. Nasal washings and aspirates are unacceptable for Xpert Xpress SARS-CoV-2/FLU/RSV testing.  Fact Sheet for Patients: BloggerCourse.com  Fact Sheet for Healthcare Providers: SeriousBroker.it  This test is not yet approved or cleared by the [place] FDA and has been authorized for detection and/or diagnosis of SARS-CoV-2 by FDA under an Emergency Use Authorization (EUA). This EUA will remain in effect (meaning this test can be used) for the duration of the COVID-19 declaration under Section 564(b)(1) of the Act, 21 U.S.C. section 360bbb-3(b)(1), unless the authorization is terminated or revoked.  Performed at [facility], [address] [place], [place] [address]   Surgical pcr screen     Status: None   Collection Time: 02/10/21  6:16 PM   Specimen: Nasal Mucosa; Nasal Swab  Result Value Ref Range Status   MRSA, PCR NEGATIVE NEGATIVE Final   Staphylococcus aureus NEGATIVE NEGATIVE Final    Comment: (NOTE) The Xpert SA Assay (FDA approved for NASAL specimens in patients 58 years of age and older),  is one component of a comprehensive surveillance program. It is not intended to diagnose infection nor to guide or monitor treatment. Performed at [facility], [address] [place], [place] [address]         Radiology Studies: DG THORACOLUMABAR SPINE  Result Date: 02/12/2021 CLINICAL DATA:  L1 kyphoplasty EXAM: THORACOLUMBAR SPINE 1V COMPARISON:  Thoracic and lumbar radiographs 02/09/2021 FINDINGS: AP and lateral C-arm images were obtained centered at L1. Bilateral cement vertebral augmentation at L1 for fracture. Mild vertebral body height loss. Chronic  fracture of T11 unchanged from prior studies. IMPRESSION: Bilateral kyphoplasty L1 vertebral body. Electronically Signed   By: Marlan Palau M.D.   On: 02/12/2021 16:01   DG C-Arm 1-60 Min-No Report  Result Date: 02/12/2021 CLINICAL DATA:  L1 kyphoplasty EXAM: THORACOLUMBAR SPINE 1V COMPARISON:  Thoracic and lumbar radiographs 02/09/2021 FINDINGS: AP and lateral C-arm images were obtained centered at L1. Bilateral cement vertebral augmentation at L1 for fracture. Mild vertebral body height loss. Chronic fracture of T11 unchanged from prior studies. IMPRESSION: Bilateral kyphoplasty L1 vertebral body. Electronically Signed   By: Marlan Palau M.D.   On: 02/12/2021 16:01        Scheduled Meds:  amantadine  200 mg Oral Daily   [START ON 02/14/2021] aspirin  81 mg Oral BID WC   calcium-vitamin D  1 tablet Oral Q breakfast   Diroximel Fumarate  462 mg Oral BID   docusate sodium  100 mg Oral BID   DULoxetine  60 mg Oral Daily   feeding supplement  237 mL Oral BID BM   ferrous sulfate  325 mg Oral TID PC   multivitamin with minerals  1 tablet Oral Daily   polyethylene glycol  17 g Oral Daily   rosuvastatin  5 mg Oral Daily   sodium chloride flush  3 mL Intravenous Q12H   vitamin B-12  100 mcg Oral Daily   Continuous Infusions:  sodium chloride     lactated ringers 85 mL/hr at 02/12/21 1618   methocarbamol (ROBAXIN) IV       LOS: 3 days     Jacquelin Hawking, MD [facility] 02/13/2021, 1:31 PM  If 7PM-7AM, please contact night-coverage [email or web address]

## 2021-02-14 NOTE — Plan of Care (Signed)

## 2021-02-14 NOTE — Progress Notes (Signed)
PROGRESS NOTE    Carrie Martinez  RJJ:[id number] DOB: [birth year] DOA: 02/09/2021 PCP: Marlyn Corporal, PA   Brief Narrative: Carrie Martinez is a 70 y.o. female with a history of multiple sclerosis.  Patient presented secondary to fall and resultant right hip fracture and L1 compression fracture.  Patient uses a walker at baseline.  Orthopedic surgery was consulted and performed right percutaneous pinning.  Plan for L1 kyphoplasty.  PT/OT recommending SNF on discharge.   Assessment & Plan:   Active Problems:   Closed right hip fracture, initial encounter (HCC)   Closed compression fracture of body of L1 vertebra (HCC)   Multiple sclerosis (HCC)   Hip fracture (HCC)   S/P ORIF right hip (open reduction internal fixation) fracture   Lumbar compression fracture (HCC)   Right hip fracture Secondary to fall. Orthopedic surgery consulted and performed percutaneous pinning on 8/29. Orthopedic surgery recommendations for 25-50% right hip weightbearing with return to clinic in 2 weeks. PT/OT recommending SNF on discharge. Hemoglobin stable.  L1 compression fracture Secondary to fall. Patient underwent successful kyphoplasty.  Leukocytosis In setting of trauma. Quickly resolved.  Multiple sclerosis -Continue amantadine, Diroximel -Continue Cymbalta  Hyperlipidemia -Continue Crestor   DVT prophylaxis: Per orthopedic surgery: SCDs, aspirin 81 mg BID Code Status:   Code Status: Full Code Family Communication: None at bedside Disposition Plan: Medically stable for discharge on 9/1. Discharge to SNF when bed is available.   Consultants:  Orthopedic surgery  Procedures:  PERCUTANEOUS PINNING OF RIGHT HIP (02/10/2021) L1 KYPHOPLASTY (02/12/2021)  Antimicrobials: None    Subjective: No issues/concerns. Worked well with physical therapy today. Is hoping to have a bowel movement soon.  Objective: Vitals:   02/13/21 2105 02/14/21 0650 02/14/21 0905 02/14/21 1103  BP: 129/66  100/60 113/62 90/64  Pulse: 85 89 77 100  Resp: 18 16 17 17   Temp: 98.5 F (36.9 C) 98 F (36.7 C) 97.9 F (36.6 C) 97.6 F (36.4 C)  TempSrc: Oral  Oral Oral  SpO2: 93% 93% 97% 98%  Weight:      Height:        Intake/Output Summary (Last 24 hours) at 02/14/2021 1504 Last data filed at 02/13/2021 2105 Gross per 24 hour  Intake 240 ml  Output 250 ml  Net -10 ml    Filed Weights   02/09/21 2114  Weight: 54.4 kg    Examination:  General exam: Appears calm and comfortable Respiratory system: Clear to auscultation. Respiratory effort normal. Cardiovascular system: S1 & S2 heard, RRR. No murmurs, rubs, gallops or clicks. Gastrointestinal system: Abdomen is nondistended, soft and nontender. No organomegaly or masses felt. Normal bowel sounds heard. Central nervous system: Alert and oriented. No focal neurological deficits. Musculoskeletal: No edema. Skin: No cyanosis. No rashes Psychiatry: Judgement and insight appear normal. Mood & affect appropriate.    Data Reviewed: I have personally reviewed following labs and imaging studies  CBC Lab Results  Component Value Date   WBC 8.8 02/12/2021   RBC 3.60 (L) 02/12/2021   HGB 12.0 02/12/2021   HCT 35.4 (L) 02/12/2021   MCV 98.3 02/12/2021   MCH 33.3 02/12/2021   PLT 156 02/12/2021   MCHC 33.9 02/12/2021   RDW 13.1 02/12/2021   LYMPHSABS 1.1 02/09/2021   MONOABS 1.3 (H) 02/09/2021   EOSABS 0.0 02/09/2021   BASOSABS 0.1 02/09/2021     Last metabolic panel Lab Results  Component Value Date   NA 136 02/11/2021   K 3.8 02/11/2021  CL 103 02/11/2021   CO2 22 02/11/2021   BUN 6 (L) 02/11/2021   CREATININE 0.52 02/11/2021   GLUCOSE 148 (H) 02/11/2021   GFRNONAA >60 02/11/2021   CALCIUM 8.8 (L) 02/11/2021   ANIONGAP 11 02/11/2021    CBG (last 3)  No results for input(s): GLUCAP in the last 72 hours.   GFR: Estimated Creatinine Clearance: 57 mL/min (by C-G formula based on SCr of 0.52 mg/dL).  Coagulation  Profile: No results for input(s): INR, PROTIME in the last 168 hours.  Recent Results (from the past 240 hour(s))  Resp Panel by RT-PCR (Flu A&B, Covid) Nasopharyngeal Swab     Status: None   Collection Time: 02/10/21 12:03 AM   Specimen: Nasopharyngeal Swab; Nasopharyngeal(NP) swabs in vial transport medium  Result Value Ref Range Status   SARS Coronavirus 2 by RT PCR NEGATIVE NEGATIVE Final    Comment: (NOTE) SARS-CoV-2 target nucleic acids are NOT DETECTED.  The SARS-CoV-2 RNA is generally detectable in upper respiratory specimens during the acute phase of infection. The lowest concentration of SARS-CoV-2 viral copies this assay can detect is 138 copies/mL. A negative result does not preclude SARS-Cov-2 infection and should not be used as the sole basis for treatment or other patient management decisions. A negative result may occur with  improper specimen collection/handling, submission of specimen other than nasopharyngeal swab, presence of viral mutation(s) within the areas targeted by this assay, and inadequate number of viral copies(<138 copies/mL). A negative result must be combined with clinical observations, patient history, and epidemiological information. The expected result is Negative.  Fact Sheet for Patients:  BloggerCourse.com  Fact Sheet for Healthcare Providers:  SeriousBroker.it  This test is no t yet approved or cleared by the [place] FDA and  has been authorized for detection and/or diagnosis of SARS-CoV-2 by FDA under an Emergency Use Authorization (EUA). This EUA will remain  in effect (meaning this test can be used) for the duration of the COVID-19 declaration under Section 564(b)(1) of the Act, 21 U.S.C.section 360bbb-3(b)(1), unless the authorization is terminated  or revoked sooner.       Influenza A by PCR NEGATIVE NEGATIVE Final   Influenza B by PCR NEGATIVE NEGATIVE Final    Comment:  (NOTE) The Xpert Xpress SARS-CoV-2/FLU/RSV plus assay is intended as an aid in the diagnosis of influenza from Nasopharyngeal swab specimens and should not be used as a sole basis for treatment. Nasal washings and aspirates are unacceptable for Xpert Xpress SARS-CoV-2/FLU/RSV testing.  Fact Sheet for Patients: BloggerCourse.com  Fact Sheet for Healthcare Providers: SeriousBroker.it  This test is not yet approved or cleared by the [place] FDA and has been authorized for detection and/or diagnosis of SARS-CoV-2 by FDA under an Emergency Use Authorization (EUA). This EUA will remain in effect (meaning this test can be used) for the duration of the COVID-19 declaration under Section 564(b)(1) of the Act, 21 U.S.C. section 360bbb-3(b)(1), unless the authorization is terminated or revoked.  Performed at [facility], [address], [place], [place] [address]   Surgical pcr screen     Status: None   Collection Time: 02/10/21  6:16 PM   Specimen: Nasal Mucosa; Nasal Swab  Result Value Ref Range Status   MRSA, PCR NEGATIVE NEGATIVE Final   Staphylococcus aureus NEGATIVE NEGATIVE Final    Comment: (NOTE) The Xpert SA Assay (FDA approved for NASAL specimens in patients 72 years of age and older), is one component of a comprehensive surveillance program. It is  not intended to diagnose infection nor to guide or monitor treatment. Performed at [facility], [address] [place], [place] [address]         Radiology Studies: No results found.      Scheduled Meds:  amantadine  200 mg Oral Daily   aspirin  81 mg Oral BID WC   calcium-vitamin D  1 tablet Oral Q breakfast   Diroximel Fumarate  462 mg Oral BID   docusate sodium  100 mg Oral BID   DULoxetine  60 mg Oral Daily   feeding supplement  237 mL Oral BID BM   ferrous sulfate  325 mg Oral TID PC   multivitamin with minerals  1 tablet Oral Daily    polyethylene glycol  17 g Oral Daily   rosuvastatin  5 mg Oral Daily   sodium chloride flush  3 mL Intravenous Q12H   vitamin B-12  100 mcg Oral Daily   Continuous Infusions:  sodium chloride     lactated ringers 85 mL/hr at 02/12/21 1618   methocarbamol (ROBAXIN) IV       LOS: 4 days     Jacquelin Hawking, MD [facility] 02/14/2021, 3:04 PM  If 7PM-7AM, please contact night-coverage [email or web address]

## 2021-02-14 NOTE — Plan of Care (Signed)
  Problem: Education: Goal: Knowledge of General Education information will improve Description: Including pain rating scale, medication(s)/side effects and non-pharmacologic comfort measures 02/14/2021 0445 by Vincent Gros, RN Outcome: Progressing 02/14/2021 0443 by Vincent Gros, RN Outcome: Progressing   Problem: Health Behavior/Discharge Planning: Goal: Ability to manage health-related needs will improve 02/14/2021 0445 by Vincent Gros, RN Outcome: Progressing 02/14/2021 0443 by Vincent Gros, RN Outcome: Progressing   Problem: Clinical Measurements: Goal: Ability to maintain clinical measurements within normal limits will improve 02/14/2021 0445 by Vincent Gros, RN Outcome: Progressing 02/14/2021 0443 by Vincent Gros, RN Outcome: Progressing Goal: Will remain free from infection 02/14/2021 0445 by Vincent Gros, RN Outcome: Progressing 02/14/2021 0443 by Vincent Gros, RN Outcome: Progressing Goal: Diagnostic test results will improve 02/14/2021 0445 by Vincent Gros, RN Outcome: Progressing 02/14/2021 0443 by Vincent Gros, RN Outcome: Progressing Goal: Respiratory complications will improve 02/14/2021 0445 by Vincent Gros, RN Outcome: Progressing 02/14/2021 0443 by Vincent Gros, RN Outcome: Progressing Goal: Cardiovascular complication will be avoided 02/14/2021 0445 by Vincent Gros, RN Outcome: Progressing 02/14/2021 0443 by Vincent Gros, RN Outcome: Progressing   Problem: Activity: Goal: Risk for activity intolerance will decrease 02/14/2021 0445 by Vincent Gros, RN Outcome: Progressing 02/14/2021 0443 by Vincent Gros, RN Outcome: Progressing   Problem: Nutrition: Goal: Adequate nutrition will be maintained 02/14/2021 0445 by Vincent Gros, RN Outcome: Progressing 02/14/2021 0443 by Vincent Gros, RN Outcome: Progressing   Problem: Coping: Goal: Level of anxiety will decrease 02/14/2021  0445 by Vincent Gros, RN Outcome: Progressing 02/14/2021 0443 by Vincent Gros, RN Outcome: Progressing   Problem: Elimination: Goal: Will not experience complications related to bowel motility 02/14/2021 0445 by Vincent Gros, RN Outcome: Progressing 02/14/2021 0443 by Vincent Gros, RN Outcome: Progressing Goal: Will not experience complications related to urinary retention 02/14/2021 0445 by Vincent Gros, RN Outcome: Progressing 02/14/2021 0443 by Vincent Gros, RN Outcome: Progressing   Problem: Pain Managment: Goal: General experience of comfort will improve 02/14/2021 0445 by Vincent Gros, RN Outcome: Progressing 02/14/2021 0443 by Vincent Gros, RN Outcome: Progressing   Problem: Safety: Goal: Ability to remain free from injury will improve 02/14/2021 0445 by Vincent Gros, RN Outcome: Progressing 02/14/2021 0443 by Vincent Gros, RN Outcome: Progressing   Problem: Skin Integrity: Goal: Risk for impaired skin integrity will decrease 02/14/2021 0445 by Vincent Gros, RN Outcome: Progressing 02/14/2021 0443 by Vincent Gros, RN Outcome: Progressing

## 2021-02-14 NOTE — Progress Notes (Addendum)
Occupational Therapy Treatment Patient Details Name: Carrie Martinez MRN: [id number] DOB: [birth year] Today's Date: 02/14/2021    History of present illness 70 yo female admitted 02/10/21 after a fall at home. Received percutaneous pinning R hip 8/29, then received kyphoplasty for acute L1 compression fracture on 02/12/21.   PMH MS   OT comments  Pt. Seen for skilled OT session.  Moving well today.  Able to complete bed mobility mod a.  Squat pivot to bsc min/mod a.  Pivot to recliner min a.  Son present at end of session.  Reviewed transfers with both of them.  Answered all questions.    Follow Up Recommendations  CIR    Equipment Recommendations  3 in 1 bedside commode;Wheelchair (measurements OT);Wheelchair cushion (measurements OT);Hospital bed    Recommendations for Other Services Rehab consult    Precautions / Restrictions Precautions Precautions: Fall Precaution Comments: Hx MS, new L1 kyphoplasty Restrictions Weight Bearing Restrictions: Yes RLE Weight Bearing: Partial weight bearing RLE Partial Weight Bearing Percentage or Pounds: 25-50 Other Position/Activity Restrictions: back precautions       Mobility Bed Mobility Overal bed mobility: Needs Assistance Bed Mobility: Rolling;Sidelying to Sit Rolling: Min assist Sidelying to sit: Min assist       General bed mobility comments: cues for use of b ues on bed rails to initiate roll able to guide lle with intemittent min a, r le with min/mod a. able to push through elbow into sitting without assistance.  cues to relax and scoot to eob b feet on the floor    Transfers   Equipment used: Rolling walker (2 wheeled) Transfers: Pharmacologist;Sit to/from Stand Sit to Stand: Mod assist;From elevated surface Stand pivot transfers: Mod assist       General transfer comment: cues for sequencing and hand placement during pivot. able to maintain wbs without cues    Balance                                            ADL either performed or assessed with clinical judgement   ADL Overall ADL's : Needs assistance/impaired                         Toilet Transfer: Moderate assistance;Stand-pivot;BSC;Cueing for sequencing;Cueing for safety   Toileting- Clothing Manipulation and Hygiene: Set up;Sitting/lateral lean       Functional mobility during ADLs: Moderate assistance;Rolling walker General ADL Comments: making gains able to complete multiple transfers this am     Vision       Perception     Praxis      Cognition Arousal/Alertness: Awake/alert Behavior During Therapy: WFL for tasks assessed/performed Overall Cognitive Status: Within Functional Limits for tasks assessed                                          Exercises     Shoulder Instructions       General Comments      Pertinent Vitals/ Pain       Pain Assessment: 0-10 Pain Score: 5  Pain Location: RLE Pain Descriptors / Indicators: Aching;Operative site guarding;Grimacing Pain Intervention(s): Limited activity within patient's tolerance;Monitored during session;Repositioned  Home Living  Prior Functioning/Environment              Frequency  Min 2X/week        Progress Toward Goals  OT Goals(current goals can now be found in the care plan section)  Progress towards OT goals: Progressing toward goals     Plan      Co-evaluation                 AM-PAC OT "6 Clicks" Daily Activity     Outcome Measure   Help from another person eating meals?: A Little Help from another person taking care of personal grooming?: A Little Help from another person toileting, which includes using toliet, bedpan, or urinal?: A Lot Help from another person bathing (including washing, rinsing, drying)?: A Lot Help from another person to put on and taking off regular upper body clothing?: None Help from another person  to put on and taking off regular lower body clothing?: A Lot 6 Click Score: 16    End of Session Equipment Utilized During Treatment: Gait belt;Rolling walker  OT Visit Diagnosis: Unsteadiness on feet (R26.81);Repeated falls (R29.6);Muscle weakness (generalized) (M62.81);History of falling (Z91.81);Pain   Activity Tolerance Patient tolerated treatment well   Patient Left in chair;with call bell/phone within reach;with family/visitor present   Nurse Communication   Reviewed with rn assistance for patient back to bed approx. 1 hr. And transfer to L if able for ease for pt.        Time: 7619-5093 OT Time Calculation (min): 36 min  Charges: OT General Charges $OT Visit: 1 Visit OT Treatments $Self Care/Home Management : 23-37 mins  Boneta Lucks, COTA/L Acute Rehabilitation [phone]    Salvadore Oxford 02/14/2021, 10:05 AM

## 2021-02-14 NOTE — Progress Notes (Signed)
Subjective: 2 Days Post-Op Procedure(s) (LRB): LUMBAR ONE KYPHOPLASTY (N/A) 4 days posdays post-op right hip percutaneous pinning Patient reports pain as mild.   Back pain improved. Right hip pain improved. Typical MS type leg pain is the only complaint. Tolerating PO without N/V Denies sweats/chills.   Objective: Vital signs in last 24 hours: Temp:  [97.8 F (36.6 C)-98.5 F (36.9 C)] 98 F (36.7 C) (09/02 0650) Pulse Rate:  [85-93] 89 (09/02 0650) Resp:  [16-20] 16 (09/02 0650) BP: (98-129)/(51-66) 100/60 (09/02 0650) SpO2:  [92 %-93 %] 93 % (09/02 0650)  Intake/Output from previous day: 09/01 0701 - 09/02 0700 In: 1140.3 [P.O.:240; I.V.:900.3] Out: 500 [Urine:500] Intake/Output this shift: No intake/output data recorded.  Recent Labs    02/12/21 0055  HGB 12.0   Recent Labs    02/12/21 0055  WBC 8.8  RBC 3.60*  HCT 35.4*  PLT 156   No results for input(s): NA, K, CL, CO2, BUN, CREATININE, GLUCOSE, CALCIUM in the last 72 hours. No results for input(s): LABPT, INR in the last 72 hours.  AAOx3, NAD Hip dressing C/D/I no drainage, Lumbar dressings C/D/I no drainage Distal sensation intact, slightly decreased on right compared to left-- patient reports this is baseline due to MS Left dorsiflexion/planterflexion in tact, right difficult, patient states this is baseline due to MS. She can wiggle toes. Palpable DP pulses, good cap refill bilaterally. Compartments safe and non-tender.    Assessment/Plan: 2 Days Post-Op Procedure(s) (LRB): LUMBAR ONE KYPHOPLASTY (N/A) 4 days post-op right hip percutaneous pinning 3 days post-op right hip percutaneous pinning Advance diet Up with therapy DVT ppx: ASA 81 mg BID to restart today No PT restrictions in regards to Lumbar spine. Hip restrictions per Dr. Charlann Boxer  Plan is for patient to D/C to SNF when bed is available. Discharge instructions for hip and back in. Scripts for Hydrocodone/acetaminophen, robaxin, zofran, and  ASA signed & in chart.   Patient will F/u with Dr. Shon Baton or I in 2 weeks in the office    Carrie Martinez 02/14/2021, 7:17 AM

## 2021-02-14 NOTE — TOC Progression Note (Signed)
Transition of Care [facility]) - Progression Note    Patient Details  Name: Carrie Martinez MRN: [id number] Date of Birth: 1950/12/24  [facility]) CM/SW Contact  Ralene Bathe, LCSWA Phone Number: 02/14/2021, 1:40 PM  Clinical Narrative:     CSW presented be offers to the family.  The family choose [facility] and the facility can accept.  CSW informed the facility that insurance Berkley Harvey has not been received.  The facility does not accept over the weekend.  If insurance Berkley Harvey is received, the patient can d/c on Monday if medically ready.    Pending: insurance auth.  Expected Discharge Plan: Skilled Nursing Facility Barriers to Discharge: English as a second language teacher, SNF Pending bed offer  Expected Discharge Plan and Services Expected Discharge Plan: Skilled Nursing Facility       Living arrangements for the past 2 months: Single Family Home                                       Social Determinants of Health (SDOH) Interventions    Readmission Risk Interventions No flowsheet data found.

## 2021-02-15 MED ORDER — POLYETHYLENE GLYCOL 3350 17 G PO PACK
17.0000 g | PACK | Freq: Two times a day (BID) | ORAL | Status: DC
  Administered 2021-02-15: 17 g via ORAL
  Filled 2021-02-15 (×2): qty 1

## 2021-02-15 MED ORDER — MILK AND MOLASSES ENEMA
1.0000 | Freq: Once | RECTAL | Status: DC | PRN
  Filled 2021-02-15: qty 240

## 2021-02-15 MED ORDER — SENNA 8.6 MG PO TABS
1.0000 | ORAL_TABLET | Freq: Every day | ORAL | Status: DC
  Administered 2021-02-15: 8.6 mg via ORAL
  Filled 2021-02-15 (×2): qty 1

## 2021-02-15 NOTE — Progress Notes (Signed)
Subjective: 3 Days Post-Op Procedure(s) (LRB): LUMBAR ONE KYPHOPLASTY (N/A) 4 days posdays post-op right hip percutaneous pinning Patient reports pain as mild.   Back pain improved. Right hip pain improved. Typical MS type leg pain is the only complaint. Tolerating PO without N/V. -BM Denies sweats/chills.   Objective: Vital signs in last 24 hours: Temp:  [97.4 F (36.3 C)-98.5 F (36.9 C)] 97.4 F (36.3 C) (09/03 0742) Pulse Rate:  [64-100] 64 (09/03 0742) Resp:  [16-18] 16 (09/03 0742) BP: (90-118)/(41-64) 118/53 (09/03 0742) SpO2:  [96 %-98 %] 96 % (09/03 0742)  Intake/Output from previous day: 09/02 0701 - 09/03 0700 In: 240 [P.O.:240] Out: -  Intake/Output this shift: No intake/output data recorded.  No results for input(s): HGB in the last 72 hours.  No results for input(s): WBC, RBC, HCT, PLT in the last 72 hours.  No results for input(s): NA, K, CL, CO2, BUN, CREATININE, GLUCOSE, CALCIUM in the last 72 hours. No results for input(s): LABPT, INR in the last 72 hours.  AAOx3, NAD Hip dressing C/D/I no drainage, Lumbar dressings C/D/I no drainage Distal sensation intact, slightly decreased on right compared to left-- patient reports this is baseline due to MS Left dorsiflexion/planterflexion in tact, right difficult, patient states this is baseline due to MS. She can wiggle toes. Palpable DP pulses, good cap refill bilaterally. Compartments safe and non-tender.    Assessment/Plan: 3 Days Post-Op Procedure(s) (LRB): LUMBAR ONE KYPHOPLASTY (N/A) 4 days post-op right hip percutaneous pinning 3 days post-op right hip percutaneous pinning Advance diet Up with therapy DVT ppx: ASA 81 mg BID to restart today No PT restrictions in regards to Lumbar spine. Hip restrictions per Dr. Charlann Boxer  Plan is for patient to D/C to SNF when bed is available. Discharge instructions for hip and back in. Scripts for Hydrocodone/acetaminophen, robaxin, zofran, and ASA signed & in  chart.   Patient will F/u with Dr. Shon Baton or I in 2 weeks in the office    Darrick Grinder 02/15/2021, 9:53 AM

## 2021-02-15 NOTE — Plan of Care (Signed)

## 2021-02-15 NOTE — Progress Notes (Signed)
PROGRESS NOTE    Carrie Martinez  ALP:[id number] DOB: 10/13/1950 DOA: 02/09/2021 PCP: Marlyn Corporal, PA   Brief Narrative: Carrie Martinez is a 70 y.o. female with a history of multiple sclerosis.  Patient presented secondary to fall and resultant right hip fracture and L1 compression fracture.  Patient uses a walker at baseline.  Orthopedic surgery was consulted and performed right percutaneous pinning.  Plan for L1 kyphoplasty.  PT/OT recommending SNF on discharge.   Assessment & Plan:   Active Problems:   Closed right hip fracture, initial encounter (HCC)   Closed compression fracture of body of L1 vertebra (HCC)   Multiple sclerosis (HCC)   Hip fracture (HCC)   S/P ORIF right hip (open reduction internal fixation) fracture   Lumbar compression fracture (HCC)   Right hip fracture Secondary to fall. Orthopedic surgery consulted and performed percutaneous pinning on 8/29. Orthopedic surgery recommendations for 25-50% right hip weightbearing with return to clinic in 2 weeks. PT/OT recommending SNF on discharge. Hemoglobin stable.  L1 compression fracture Secondary to fall. Patient underwent successful kyphoplasty.  Leukocytosis In setting of trauma. Quickly resolved.  Multiple sclerosis -Continue amantadine, Diroximel -Continue Cymbalta  Hyperlipidemia -Continue Crestor   DVT prophylaxis: Per orthopedic surgery: SCDs, aspirin 81 mg BID Code Status:   Code Status: Full Code Family Communication: None at bedside Disposition Plan: Medically stable for discharge on 9/1. Discharge to SNF when bed is available.   Consultants:  Orthopedic surgery  Procedures:  PERCUTANEOUS PINNING OF RIGHT HIP (02/10/2021) L1 KYPHOPLASTY (02/12/2021)  Antimicrobials: None    Subjective: No bowel movement. Some right leg pain. No other concerns.  Objective: Vitals:   02/14/21 0905 02/14/21 1103 02/14/21 2125 02/15/21 0742  BP: 113/62 90/64 (!) 117/41 (!) 118/53  Pulse: 77 100  73 64  Resp: 17 17 18 16   Temp: 97.9 F (36.6 C) 97.6 F (36.4 C) 98.5 F (36.9 C) (!) 97.4 F (36.3 C)  TempSrc: Oral Oral Oral Oral  SpO2: 97% 98% 96% 96%  Weight:      Height:        Intake/Output Summary (Last 24 hours) at 02/15/2021 0839 Last data filed at 02/15/2021 0338 Gross per 24 hour  Intake 240 ml  Output --  Net 240 ml    Filed Weights   02/09/21 2114  Weight: 54.4 kg    Examination:  General exam: Appears calm and comfortable  Respiratory system: Clear to auscultation. Respiratory effort normal. Cardiovascular system: S1 & S2 heard, RRR. No murmurs, rubs, gallops or clicks. Gastrointestinal system: Abdomen is nondistended, soft and nontender. No organomegaly or masses felt. Normal bowel sounds heard. Central nervous system: Alert and oriented. No focal neurological deficits. Musculoskeletal: No edema. No calf tenderness. Right thigh dressing clean and intact Skin: No cyanosis. No rashes Psychiatry: Judgement and insight appear normal. Mood & affect appropriate.    Data Reviewed: I have personally reviewed following labs and imaging studies  CBC Lab Results  Component Value Date   WBC 8.8 02/12/2021   RBC 3.60 (L) 02/12/2021   HGB 12.0 02/12/2021   HCT 35.4 (L) 02/12/2021   MCV 98.3 02/12/2021   MCH 33.3 02/12/2021   PLT 156 02/12/2021   MCHC 33.9 02/12/2021   RDW 13.1 02/12/2021   LYMPHSABS 1.1 02/09/2021   MONOABS 1.3 (H) 02/09/2021   EOSABS 0.0 02/09/2021   BASOSABS 0.1 02/09/2021     Last metabolic panel Lab Results  Component Value Date   NA 136 02/11/2021  K 3.8 02/11/2021   CL 103 02/11/2021   CO2 22 02/11/2021   BUN 6 (L) 02/11/2021   CREATININE 0.52 02/11/2021   GLUCOSE 148 (H) 02/11/2021   GFRNONAA >60 02/11/2021   CALCIUM 8.8 (L) 02/11/2021   ANIONGAP 11 02/11/2021    CBG (last 3)  No results for input(s): GLUCAP in the last 72 hours.   GFR: Estimated Creatinine Clearance: 57 mL/min (by C-G formula based on SCr of 0.52  mg/dL).  Coagulation Profile: No results for input(s): INR, PROTIME in the last 168 hours.  Recent Results (from the past 240 hour(s))  Resp Panel by RT-PCR (Flu A&B, Covid) Nasopharyngeal Swab     Status: None   Collection Time: 02/10/21 12:03 AM   Specimen: Nasopharyngeal Swab; Nasopharyngeal(NP) swabs in vial transport medium  Result Value Ref Range Status   SARS Coronavirus 2 by RT PCR NEGATIVE NEGATIVE Final    Comment: (NOTE) SARS-CoV-2 target nucleic acids are NOT DETECTED.  The SARS-CoV-2 RNA is generally detectable in upper respiratory specimens during the acute phase of infection. The lowest concentration of SARS-CoV-2 viral copies this assay can detect is 138 copies/mL. A negative result does not preclude SARS-Cov-2 infection and should not be used as the sole basis for treatment or other patient management decisions. A negative result may occur with  improper specimen collection/handling, submission of specimen other than nasopharyngeal swab, presence of viral mutation(s) within the areas targeted by this assay, and inadequate number of viral copies(<138 copies/mL). A negative result must be combined with clinical observations, patient history, and epidemiological information. The expected result is Negative.  Fact Sheet for Patients:  BloggerCourse.com  Fact Sheet for Healthcare Providers:  SeriousBroker.it  This test is no t yet approved or cleared by the [place] FDA and  has been authorized for detection and/or diagnosis of SARS-CoV-2 by FDA under an Emergency Use Authorization (EUA). This EUA will remain  in effect (meaning this test can be used) for the duration of the COVID-19 declaration under Section 564(b)(1) of the Act, 21 U.S.C.section 360bbb-3(b)(1), unless the authorization is terminated  or revoked sooner.       Influenza A by PCR NEGATIVE NEGATIVE Final   Influenza B by PCR NEGATIVE NEGATIVE  Final    Comment: (NOTE) The Xpert Xpress SARS-CoV-2/FLU/RSV plus assay is intended as an aid in the diagnosis of influenza from Nasopharyngeal swab specimens and should not be used as a sole basis for treatment. Nasal washings and aspirates are unacceptable for Xpert Xpress SARS-CoV-2/FLU/RSV testing.  Fact Sheet for Patients: BloggerCourse.com  Fact Sheet for Healthcare Providers: SeriousBroker.it  This test is not yet approved or cleared by the [place] FDA and has been authorized for detection and/or diagnosis of SARS-CoV-2 by FDA under an Emergency Use Authorization (EUA). This EUA will remain in effect (meaning this test can be used) for the duration of the COVID-19 declaration under Section 564(b)(1) of the Act, 21 U.S.C. section 360bbb-3(b)(1), unless the authorization is terminated or revoked.  Performed at [facility], [address] [place], [place] [address]   Surgical pcr screen     Status: None   Collection Time: 02/10/21  6:16 PM   Specimen: Nasal Mucosa; Nasal Swab  Result Value Ref Range Status   MRSA, PCR NEGATIVE NEGATIVE Final   Staphylococcus aureus NEGATIVE NEGATIVE Final    Comment: (NOTE) The Xpert SA Assay (FDA approved for NASAL specimens in patients 60 years of age and older), is one component of a  comprehensive surveillance program. It is not intended to diagnose infection nor to guide or monitor treatment. Performed at [facility], [address] [place], [place] [address]         Radiology Studies: No results found.      Scheduled Meds:  amantadine  200 mg Oral Daily   aspirin  81 mg Oral BID WC   calcium-vitamin D  1 tablet Oral Q breakfast   Diroximel Fumarate  462 mg Oral BID   docusate sodium  100 mg Oral BID   DULoxetine  60 mg Oral Daily   feeding supplement  237 mL Oral BID BM   ferrous sulfate  325 mg Oral TID PC   multivitamin with minerals  1 tablet  Oral Daily   polyethylene glycol  17 g Oral Daily   rosuvastatin  5 mg Oral Daily   sodium chloride flush  3 mL Intravenous Q12H   vitamin B-12  100 mcg Oral Daily   Continuous Infusions:  sodium chloride     lactated ringers 85 mL/hr at 02/12/21 1618   methocarbamol (ROBAXIN) IV       LOS: 5 days     Jacquelin Hawking, MD [facility] 02/15/2021, 8:39 AM  If 7PM-7AM, please contact night-coverage [email or web address]

## 2021-02-15 NOTE — TOC Progression Note (Deleted)
Transition of Care [facility]) - Progression Note    Patient Details  Name: Carrie Martinez MRN: [id number] Date of Birth: 11-15-1950  [facility]) CM/SW Contact  Levada Schilling Phone Number: 02/15/2021, 10:18 AM  Clinical Narrative:     Pt does not have any beds offers.  TOC will continue to assist with disposition planning.  Expected Discharge Plan: Skilled Nursing Facility Barriers to Discharge: English as a second language teacher, SNF Pending bed offer  Expected Discharge Plan and Services Expected Discharge Plan: Skilled Nursing Facility       Living arrangements for the past 2 months: Single Family Home                                       Social Determinants of Health (SDOH) Interventions    Readmission Risk Interventions No flowsheet data found.

## 2021-02-15 NOTE — TOC Progression Note (Signed)
Transition of Care [facility]) - Progression Note    Patient Details  Name: Carrie Martinez MRN: [id number] Date of Birth: [birth year]  Transition of Care [facility]) CM/SW Contact  Levada Schilling Phone Number: 02/15/2021, 10:32 AM  Clinical Narrative:    CSW received insurance auth # [id number] effective 02/15/21 .  Days will not start until pt is at facility.  TOC will continue to assist with disposition planning.   Expected Discharge Plan: Skilled Nursing Facility Barriers to Discharge: English as a second language teacher, SNF Pending bed offer  Expected Discharge Plan and Services Expected Discharge Plan: Skilled Nursing Facility       Living arrangements for the past 2 months: Single Family Home                                       Social Determinants of Health (SDOH) Interventions    Readmission Risk Interventions No flowsheet data found.

## 2021-02-15 NOTE — Plan of Care (Signed)
?  Problem: Clinical Measurements: ?Goal: Ability to maintain clinical measurements within normal limits will improve ?Outcome: Progressing ?Goal: Will remain free from infection ?Outcome: Progressing ?Goal: Diagnostic test results will improve ?Outcome: Progressing ?Goal: Respiratory complications will improve ?Outcome: Progressing ?Goal: Cardiovascular complication will be avoided ?Outcome: Progressing ?  ?Problem: Activity: ?Goal: Risk for activity intolerance will decrease ?Outcome: Progressing ?  ?Problem: Elimination: ?Goal: Will not experience complications related to bowel motility ?Outcome: Progressing ?Goal: Will not experience complications related to urinary retention ?Outcome: Progressing ?  ?Problem: Pain Managment: ?Goal: General experience of comfort will improve ?Outcome: Progressing ?  ?Problem: Safety: ?Goal: Ability to remain free from injury will improve ?Outcome: Progressing ?  ?Problem: Skin Integrity: ?Goal: Risk for impaired skin integrity will decrease ?Outcome: Progressing ?  ?

## 2021-02-16 NOTE — Plan of Care (Signed)

## 2021-02-16 NOTE — Progress Notes (Signed)
PROGRESS NOTE    Carrie Martinez  WUJ:[id number] DOB: [birth year] DOA: 02/09/2021 PCP: Marlyn Corporal, PA   Brief Narrative: Carrie Martinez is a 70 y.o. female with a history of multiple sclerosis.  Patient presented secondary to fall and resultant right hip fracture and L1 compression fracture.  Patient uses a walker at baseline.  Orthopedic surgery was consulted and performed right percutaneous pinning.  Plan for L1 kyphoplasty.  PT/OT recommending SNF on discharge.   Assessment & Plan:   Active Problems:   Closed right hip fracture, initial encounter (HCC)   Closed compression fracture of body of L1 vertebra (HCC)   Multiple sclerosis (HCC)   Hip fracture (HCC)   S/P ORIF right hip (open reduction internal fixation) fracture   Lumbar compression fracture (HCC)   Right hip fracture Secondary to fall. Orthopedic surgery consulted and performed percutaneous pinning on 8/29. Orthopedic surgery recommendations for 25-50% right hip weightbearing with return to clinic in 2 weeks. PT/OT recommending SNF on discharge. Hemoglobin stable.  L1 compression fracture Secondary to fall. Patient underwent successful kyphoplasty.  Leukocytosis In setting of trauma. Quickly resolved.  Multiple sclerosis -Continue amantadine, Diroximel -Continue Cymbalta  Hyperlipidemia -Continue Crestor   DVT prophylaxis: Per orthopedic surgery: SCDs, aspirin 81 mg BID Code Status:   Code Status: Full Code Family Communication: None at bedside Disposition Plan: Medically stable for discharge on 9/1. Discharge to SNF when bed is available.   Consultants:  Orthopedic surgery  Procedures:  PERCUTANEOUS PINNING OF RIGHT HIP (02/10/2021) L1 KYPHOPLASTY (02/12/2021)  Antimicrobials: None    Subjective: Continues to have some right leg pain. No other concerns.  Objective: Vitals:   02/15/21 0742 02/15/21 1547 02/15/21 2138 02/16/21 0757  BP: (!) 118/53 (!) 110/96 (!) 105/49 (!) 115/41  Pulse:  64 96 76 78  Resp: 16  17 18   Temp: (!) 97.4 F (36.3 C) 97.9 F (36.6 C) 98.3 F (36.8 C) 98.2 F (36.8 C)  TempSrc: Oral Oral Oral Oral  SpO2: 96% 95% 96% 97%  Weight:      Height:        Intake/Output Summary (Last 24 hours) at 02/16/2021 1332 Last data filed at 02/16/2021 1227 Gross per 24 hour  Intake 360 ml  Output --  Net 360 ml    Filed Weights   02/09/21 2114  Weight: 54.4 kg    Examination:  General exam: Appears calm and comfortable Respiratory system: Clear to auscultation. Respiratory effort normal. Cardiovascular system: S1 & S2 heard, RRR. Gastrointestinal system: Abdomen is nondistended, soft and nontender. No organomegaly or masses felt. Normal bowel sounds heard. Central nervous system: Alert and oriented. No focal neurological deficits. Musculoskeletal: No edema. No calf tenderness Skin: No cyanosis. No rashes Psychiatry: Judgement and insight appear normal. Mood & affect appropriate.    Data Reviewed: I have personally reviewed following labs and imaging studies  CBC Lab Results  Component Value Date   WBC 8.8 02/12/2021   RBC 3.60 (L) 02/12/2021   HGB 12.0 02/12/2021   HCT 35.4 (L) 02/12/2021   MCV 98.3 02/12/2021   MCH 33.3 02/12/2021   PLT 156 02/12/2021   MCHC 33.9 02/12/2021   RDW 13.1 02/12/2021   LYMPHSABS 1.1 02/09/2021   MONOABS 1.3 (H) 02/09/2021   EOSABS 0.0 02/09/2021   BASOSABS 0.1 02/09/2021     Last metabolic panel Lab Results  Component Value Date   NA 136 02/11/2021   K 3.8 02/11/2021   CL 103 02/11/2021  CO2 22 02/11/2021   BUN 6 (L) 02/11/2021   CREATININE 0.52 02/11/2021   GLUCOSE 148 (H) 02/11/2021   GFRNONAA >60 02/11/2021   CALCIUM 8.8 (L) 02/11/2021   ANIONGAP 11 02/11/2021    CBG (last 3)  No results for input(s): GLUCAP in the last 72 hours.   GFR: Estimated Creatinine Clearance: 57 mL/min (by C-G formula based on SCr of 0.52 mg/dL).  Coagulation Profile: No results for input(s): INR, PROTIME  in the last 168 hours.  Recent Results (from the past 240 hour(s))  Resp Panel by RT-PCR (Flu A&B, Covid) Nasopharyngeal Swab     Status: None   Collection Time: 02/10/21 12:03 AM   Specimen: Nasopharyngeal Swab; Nasopharyngeal(NP) swabs in vial transport medium  Result Value Ref Range Status   SARS Coronavirus 2 by RT PCR NEGATIVE NEGATIVE Final    Comment: (NOTE) SARS-CoV-2 target nucleic acids are NOT DETECTED.  The SARS-CoV-2 RNA is generally detectable in upper respiratory specimens during the acute phase of infection. The lowest concentration of SARS-CoV-2 viral copies this assay can detect is 138 copies/mL. A negative result does not preclude SARS-Cov-2 infection and should not be used as the sole basis for treatment or other patient management decisions. A negative result may occur with  improper specimen collection/handling, submission of specimen other than nasopharyngeal swab, presence of viral mutation(s) within the areas targeted by this assay, and inadequate number of viral copies(<138 copies/mL). A negative result must be combined with clinical observations, patient history, and epidemiological information. The expected result is Negative.  Fact Sheet for Patients:  BloggerCourse.com  Fact Sheet for Healthcare Providers:  SeriousBroker.it  This test is no t yet approved or cleared by the [place] FDA and  has been authorized for detection and/or diagnosis of SARS-CoV-2 by FDA under an Emergency Use Authorization (EUA). This EUA will remain  in effect (meaning this test can be used) for the duration of the COVID-19 declaration under Section 564(b)(1) of the Act, 21 U.S.C.section 360bbb-3(b)(1), unless the authorization is terminated  or revoked sooner.       Influenza A by PCR NEGATIVE NEGATIVE Final   Influenza B by PCR NEGATIVE NEGATIVE Final    Comment: (NOTE) The Xpert Xpress SARS-CoV-2/FLU/RSV plus  assay is intended as an aid in the diagnosis of influenza from Nasopharyngeal swab specimens and should not be used as a sole basis for treatment. Nasal washings and aspirates are unacceptable for Xpert Xpress SARS-CoV-2/FLU/RSV testing.  Fact Sheet for Patients: BloggerCourse.com  Fact Sheet for Healthcare Providers: SeriousBroker.it  This test is not yet approved or cleared by the [place] FDA and has been authorized for detection and/or diagnosis of SARS-CoV-2 by FDA under an Emergency Use Authorization (EUA). This EUA will remain in effect (meaning this test can be used) for the duration of the COVID-19 declaration under Section 564(b)(1) of the Act, 21 U.S.C. section 360bbb-3(b)(1), unless the authorization is terminated or revoked.  Performed at [facility], [address], [place], [place] [address]   Surgical pcr screen     Status: None   Collection Time: 02/10/21  6:16 PM   Specimen: Nasal Mucosa; Nasal Swab  Result Value Ref Range Status   MRSA, PCR NEGATIVE NEGATIVE Final   Staphylococcus aureus NEGATIVE NEGATIVE Final    Comment: (NOTE) The Xpert SA Assay (FDA approved for NASAL specimens in patients 4 years of age and older), is one component of a comprehensive surveillance program. It is not intended to diagnose infection  nor to guide or monitor treatment. Performed at [facility], [address] [place], [place] [address]         Radiology Studies: No results found.      Scheduled Meds:  amantadine  200 mg Oral Daily   aspirin  81 mg Oral BID WC   calcium-vitamin D  1 tablet Oral Q breakfast   Diroximel Fumarate  462 mg Oral BID   docusate sodium  100 mg Oral BID   DULoxetine  60 mg Oral Daily   feeding supplement  237 mL Oral BID BM   ferrous sulfate  325 mg Oral TID PC   multivitamin with minerals  1 tablet Oral Daily   polyethylene glycol  17 g Oral BID   rosuvastatin  5  mg Oral Daily   senna  1 tablet Oral Daily   sodium chloride flush  3 mL Intravenous Q12H   vitamin B-12  100 mcg Oral Daily   Continuous Infusions:  sodium chloride     lactated ringers 85 mL/hr at 02/12/21 1618   methocarbamol (ROBAXIN) IV       LOS: 6 days     Jacquelin Hawking, MD [facility] 02/16/2021, 1:32 PM  If 7PM-7AM, please contact night-coverage [email or web address]

## 2021-02-16 NOTE — Plan of Care (Signed)
  Problem: Clinical Measurements: Goal: Ability to maintain clinical measurements within normal limits will improve Outcome: Progressing Goal: Will remain free from infection Outcome: Progressing Goal: Diagnostic test results will improve Outcome: Progressing Goal: Respiratory complications will improve Outcome: Progressing Goal: Cardiovascular complication will be avoided Outcome: Progressing   Problem: Pain Managment: Goal: General experience of comfort will improve Outcome: Progressing   Problem: Safety: Goal: Ability to remain free from injury will improve Outcome: Progressing   Problem: Skin Integrity: Goal: Risk for impaired skin integrity will decrease Outcome: Progressing   

## 2021-02-16 NOTE — Plan of Care (Signed)
  Problem: Education: Goal: Knowledge of General Education information will improve Description: Including pain rating scale, medication(s)/side effects and non-pharmacologic comfort measures Outcome: Progressing   Problem: Pain Managment: Goal: General experience of comfort will improve Outcome: Progressing   Problem: Safety: Goal: Ability to remain free from injury will improve Outcome: Progressing   

## 2021-02-17 DIAGNOSIS — D649 Anemia, unspecified: Secondary | ICD-10-CM | POA: Diagnosis not present

## 2021-02-17 DIAGNOSIS — R531 Weakness: Secondary | ICD-10-CM | POA: Diagnosis not present

## 2021-02-17 DIAGNOSIS — M62838 Other muscle spasm: Secondary | ICD-10-CM | POA: Diagnosis not present

## 2021-02-17 DIAGNOSIS — M545 Low back pain, unspecified: Secondary | ICD-10-CM | POA: Diagnosis not present

## 2021-02-17 DIAGNOSIS — I342 Nonrheumatic mitral (valve) stenosis: Secondary | ICD-10-CM | POA: Diagnosis not present

## 2021-02-17 DIAGNOSIS — Z7401 Bed confinement status: Secondary | ICD-10-CM | POA: Diagnosis not present

## 2021-02-17 DIAGNOSIS — T07XXXA Unspecified multiple injuries, initial encounter: Secondary | ICD-10-CM | POA: Diagnosis not present

## 2021-02-17 DIAGNOSIS — S32010D Wedge compression fracture of first lumbar vertebra, subsequent encounter for fracture with routine healing: Secondary | ICD-10-CM | POA: Diagnosis not present

## 2021-02-17 DIAGNOSIS — M4856XA Collapsed vertebra, not elsewhere classified, lumbar region, initial encounter for fracture: Secondary | ICD-10-CM | POA: Diagnosis not present

## 2021-02-17 DIAGNOSIS — S72002D Fracture of unspecified part of neck of left femur, subsequent encounter for closed fracture with routine healing: Secondary | ICD-10-CM | POA: Diagnosis not present

## 2021-02-17 DIAGNOSIS — G35 Multiple sclerosis: Secondary | ICD-10-CM | POA: Diagnosis not present

## 2021-02-17 DIAGNOSIS — K5900 Constipation, unspecified: Secondary | ICD-10-CM | POA: Diagnosis not present

## 2021-02-17 DIAGNOSIS — M25551 Pain in right hip: Secondary | ICD-10-CM | POA: Diagnosis not present

## 2021-02-17 DIAGNOSIS — L539 Erythematous condition, unspecified: Secondary | ICD-10-CM | POA: Diagnosis not present

## 2021-02-17 DIAGNOSIS — E785 Hyperlipidemia, unspecified: Secondary | ICD-10-CM | POA: Diagnosis not present

## 2021-02-17 DIAGNOSIS — M79604 Pain in right leg: Secondary | ICD-10-CM | POA: Diagnosis not present

## 2021-02-17 DIAGNOSIS — S72001A Fracture of unspecified part of neck of right femur, initial encounter for closed fracture: Secondary | ICD-10-CM | POA: Diagnosis not present

## 2021-02-17 DIAGNOSIS — S32010A Wedge compression fracture of first lumbar vertebra, initial encounter for closed fracture: Secondary | ICD-10-CM | POA: Diagnosis not present

## 2021-02-17 DIAGNOSIS — S32000A Wedge compression fracture of unspecified lumbar vertebra, initial encounter for closed fracture: Secondary | ICD-10-CM | POA: Diagnosis not present

## 2021-02-17 DIAGNOSIS — M25561 Pain in right knee: Secondary | ICD-10-CM | POA: Diagnosis not present

## 2021-02-17 DIAGNOSIS — S72011A Unspecified intracapsular fracture of right femur, initial encounter for closed fracture: Secondary | ICD-10-CM | POA: Diagnosis not present

## 2021-02-17 DIAGNOSIS — S72001D Fracture of unspecified part of neck of right femur, subsequent encounter for closed fracture with routine healing: Secondary | ICD-10-CM | POA: Diagnosis not present

## 2021-02-17 DIAGNOSIS — Z471 Aftercare following joint replacement surgery: Secondary | ICD-10-CM | POA: Diagnosis not present

## 2021-02-17 DIAGNOSIS — W19XXXA Unspecified fall, initial encounter: Secondary | ICD-10-CM | POA: Diagnosis not present

## 2021-02-17 LAB — RESP PANEL BY RT-PCR (FLU A&B, COVID) ARPGX2
Influenza A by PCR: NEGATIVE
Influenza B by PCR: NEGATIVE
SARS Coronavirus 2 by RT PCR: NEGATIVE

## 2021-02-17 MED ORDER — METHOCARBAMOL 500 MG PO TABS: 500.0000 mg | ORAL_TABLET | Freq: Four times a day (QID) | ORAL | 0 refills | Status: DC | PRN

## 2021-02-17 MED ORDER — FERROUS SULFATE 325 (65 FE) MG PO TABS: 325.0000 mg | ORAL_TABLET | Freq: Three times a day (TID) | ORAL | 3 refills | Status: DC

## 2021-02-17 MED ORDER — SENNA 8.6 MG PO TABS: 1.0000 | ORAL_TABLET | Freq: Every day | ORAL | 0 refills | Status: DC

## 2021-02-17 NOTE — Progress Notes (Signed)
[facility] transported pt to [facility]

## 2021-02-17 NOTE — Discharge Summary (Signed)
Physician Discharge Summary  Carrie Martinez ZOX:[id number] DOB: Aug 01, 1950 DOA: 02/09/2021  PCP: Marlyn Corporal, PA  Admit date: 02/09/2021  Discharge date: 02/17/2021  Admitted From: Home.  Disposition:  SNF  Recommendations for Outpatient Follow-up:  Follow up with PCP in 1-2 weeks. Please obtain BMP/CBC in one week. Advised to follow up Orthopaedics in 2 weeks. Advised to take pain medications as needed. Advised to take aspirin 81 mg twice daily for 2 weeks.  Home Health: None Equipment/Devices: None  Discharge Condition: Good CODE STATUS:Full code Diet recommendation: Heart Healthy   Brief Summary / Hospital Course: This 70 y.o. female with PMH significant for multiple sclerosis presented secondary to fall and found to have right hip fracture and L1 compression fracture.  Patient uses  walker at baseline.  Orthopedic surgery was consulted and performed right percutaneous pinning. Later She has undergone L1 kyphoplasty, tolerated well.  Patient's pain remains better controlled.  PT/OT recommending SNF on discharge.  Patient is cleared from orthopedics to skilled nursing facility.  Patient is being discharged for rehab.  She was managed for below problems during hospitalization.    Discharge Diagnoses:  Active Problems:   Closed right hip fracture, initial encounter (HCC)   Closed compression fracture of body of L1 vertebra (HCC)   Multiple sclerosis (HCC)   Hip fracture (HCC)   S/P ORIF right hip (open reduction internal fixation) fracture   Lumbar compression fracture (HCC)  Right hip fracture/status post fall. S/p fall , orthopedic surgery consulted and performed percutaneous pinning on 8/29.  Orthopedic surgery recommendations for 25-50% right hip weightbearing with return to clinic in 2 weeks. PT/OT recommending SNF on discharge. Hemoglobin stable.   L1 compression fracture S/p fall, patient underwent L1 Kyphoplasty. Pain remains better controlled.    Leukocytosis In setting of trauma. Quickly resolved.   Multiple sclerosis -Continue amantadine, Diroximel -Continue Cymbalta   Hyperlipidemia -Continue Crestor  Discharge Instructions  Discharge Instructions     Call MD for:  persistant dizziness or light-headedness   Complete by: As directed    Call MD for:  persistant nausea and vomiting   Complete by: As directed    Call MD for:  severe uncontrolled pain   Complete by: As directed    Diet - low sodium heart healthy   Complete by: As directed    Diet Carb Modified   Complete by: As directed    Discharge instructions   Complete by: As directed    Advised to follow up Orthopaedics in 2 weeks. Advised to take pain medications as needed.   Discharge wound care:   Complete by: As directed    Follow up orthopedics   Incentive spirometry RT   Complete by: As directed    Increase activity slowly   Complete by: As directed       Allergies as of 02/17/2021   No Known Allergies      Medication List     TAKE these medications    Amantadine HCl 100 MG tablet Take 200 mg by mouth daily.   aspirin 81 MG chewable tablet Chew 1 tablet (81 mg total) by mouth 2 (two) times daily with a meal for 15 days.   calcium-vitamin D 500-200 MG-UNIT tablet Commonly known as: OSCAL WITH D Take 1 tablet by mouth daily with breakfast.   DULoxetine 60 MG capsule Commonly known as: CYMBALTA Take 60 mg by mouth daily.   ferrous sulfate 325 (65 FE) MG tablet Take 1 tablet (325 mg total) by  mouth 3 (three) times daily after meals.   gabapentin 100 MG capsule Commonly known as: NEURONTIN Take 200-300 mg by mouth at bedtime as needed for sleep.   HYDROcodone-acetaminophen 5-325 MG tablet Commonly known as: NORCO/VICODIN Take 1-2 tablets by mouth every 6 (six) hours as needed for up to 5 days for moderate pain.   methocarbamol 500 MG tablet Commonly known as: Robaxin Take 1 tablet (500 mg total) by mouth every 8 (eight) hours as  needed for up to 5 days for muscle spasms.   ondansetron 4 MG tablet Commonly known as: ZOFRAN Take 1 tablet (4 mg total) by mouth every 8 (eight) hours as needed for up to 5 days for nausea.   rosuvastatin 5 MG tablet Commonly known as: CRESTOR Take 5 mg by mouth daily.   senna 8.6 MG Tabs tablet Commonly known as: SENOKOT Take 1 tablet (8.6 mg total) by mouth daily. Start taking on: February 18, 2021   traZODone 50 MG tablet Commonly known as: DESYREL Take 50 mg by mouth at bedtime as needed for sleep.   VITAMIN B12 PO Take 1 tablet by mouth daily.   Vumerity 231 MG Cpdr Generic drug: Diroximel Fumarate Take 462 mg by mouth in the morning and at bedtime.               Discharge Care Instructions  (From admission, onward)           Start     Ordered   02/17/21 0000  Discharge wound care:       Comments: Follow up orthopedics   02/17/21 1044            Follow-up Information     Durene Romans, MD. Schedule an appointment as soon as possible for a visit in 2 week(s).   Specialty: Orthopedic Surgery Contact information: [address] [place] [address] [place] [address] [phone]         Venita Lick, MD. Schedule an appointment as soon as possible for a visit in 2 week(s).   Specialty: Orthopedic Surgery Contact information: [address] [place] [address] [place] [address] [phone]         Marlyn Corporal, PA Follow up in 1 week(s).   Specialty: Family Medicine Contact information: [address] [place] [address] [phone]                No Known Allergies  Consultations: Orthopedics   Procedures/Studies: DG Thoracic Spine 2 View  Result Date: 02/09/2021 CLINICAL DATA:  Status post fall. EXAM: THORACIC SPINE 2 VIEWS COMPARISON:  November 16, 2019 FINDINGS: There is no evidence of an acute thoracic spine fracture. Stable loss of vertebral body height is seen at the approximate level of T11. An  ill-defined deformity is seen involving the superior endplate of the L1 vertebral body on the frontal view. This is not clearly visualized on the prior study. Alignment is normal. No other significant bone abnormalities are identified. IMPRESSION: Ill-defined deformity involving the superior endplate of the L1 vertebral body which may be chronic in nature. A mild acute fracture cannot be excluded. CT correlation is recommended. Electronically Signed   By: Aram Candela M.D.   On: 02/09/2021 22:21   DG THORACOLUMABAR SPINE  Result Date: 02/12/2021 CLINICAL DATA:  L1 kyphoplasty EXAM: THORACOLUMBAR SPINE 1V COMPARISON:  Thoracic and lumbar radiographs 02/09/2021 FINDINGS: AP and lateral C-arm images were obtained centered at L1. Bilateral cement vertebral augmentation at L1 for fracture. Mild vertebral body  height loss. Chronic fracture of T11 unchanged from prior studies. IMPRESSION: Bilateral kyphoplasty L1 vertebral body. Electronically Signed   By: Marlan Palau M.D.   On: 02/12/2021 16:01   DG Lumbar Spine Complete  Result Date: 02/09/2021 CLINICAL DATA:  Status post fall. EXAM: LUMBAR SPINE - COMPLETE 4+ VIEW COMPARISON:  November 16, 2019 FINDINGS: An acute compression fracture deformity is seen involving the L1 vertebral body. Mild levoscoliosis is noted. Mild multilevel endplate sclerosis is present. Intervertebral disc spaces are maintained. IMPRESSION: 1. Acute compression fracture of the L1 vertebral body. MRI correlation is recommended. Electronically Signed   By: Aram Candela M.D.   On: 02/09/2021 22:24   DG Pelvis 1-2 Views  Result Date: 02/09/2021 CLINICAL DATA:  Status post fall. EXAM: PELVIS - 1-2 VIEW COMPARISON:  None. FINDINGS: Evaluation of the right femur is limited secondary to patient positioning. There is no evidence of pelvic fracture or diastasis. Degenerative changes are seen involving both hips in the form of joint space narrowing and acetabular sclerosis. No pelvic bone  lesions are seen. IMPRESSION: 1. No acute findings. Electronically Signed   By: Aram Candela M.D.   On: 02/09/2021 22:18   CT HEAD WO CONTRAST ( )  Result Date: 02/10/2021 CLINICAL DATA:  Fall from standing EXAM: CT HEAD WITHOUT CONTRAST CT CERVICAL SPINE WITHOUT CONTRAST TECHNIQUE: Multidetector CT imaging of the head and cervical spine was performed following the standard protocol without intravenous contrast. Multiplanar CT image reconstructions of the cervical spine were also generated. COMPARISON:  None. FINDINGS: CT HEAD FINDINGS Brain: No evidence of acute infarction, hemorrhage, hydrocephalus, extra-axial collection or mass lesion/mass effect. Mild degenerative changes of the visualized thoracolumbar spine. Vascular: No hyperdense vessel or unexpected calcification. Skull: Normal. Negative for fracture or focal lesion. Sinuses/Orbits: The visualized paranasal sinuses are essentially clear. The mastoid air cells are unopacified. Other: None. CT CERVICAL SPINE FINDINGS Alignment: Normal cervical lordosis. Skull base and vertebrae: No acute fracture. No primary bone lesion or focal pathologic process. Soft tissues and spinal canal: No prevertebral fluid or swelling. No visible canal hematoma. Disc levels: Mild degenerative changes of the mid cervical spine. Spinal canal is patent. Upper chest: Visualized lung apices are notable for biapical pleural-parenchymal scarring and mild centrilobular and paraseptal emphysematous changes. Other: Visualized thyroid is notable for a 14 mm right thyroid nodule. Not clinically significant; no follow-up imaging recommended (ref: J Am Coll Radiol. 2015 Feb;12(2): 143-50). IMPRESSION: No evidence of acute intracranial abnormality. Mild small vessel ischemic changes. No evidence of traumatic injury to the cervical spine. Mild degenerative changes. Electronically Signed   By: Charline Bills M.D.   On: 02/10/2021 01:33   CT Cervical Spine Wo Contrast  Result  Date: 02/10/2021 CLINICAL DATA:  Fall from standing EXAM: CT HEAD WITHOUT CONTRAST CT CERVICAL SPINE WITHOUT CONTRAST TECHNIQUE: Multidetector CT imaging of the head and cervical spine was performed following the standard protocol without intravenous contrast. Multiplanar CT image reconstructions of the cervical spine were also generated. COMPARISON:  None. FINDINGS: CT HEAD FINDINGS Brain: No evidence of acute infarction, hemorrhage, hydrocephalus, extra-axial collection or mass lesion/mass effect. Mild degenerative changes of the visualized thoracolumbar spine. Vascular: No hyperdense vessel or unexpected calcification. Skull: Normal. Negative for fracture or focal lesion. Sinuses/Orbits: The visualized paranasal sinuses are essentially clear. The mastoid air cells are unopacified. Other: None. CT CERVICAL SPINE FINDINGS Alignment: Normal cervical lordosis. Skull base and vertebrae: No acute fracture. No primary bone lesion or focal pathologic process. Soft tissues and spinal canal: No  prevertebral fluid or swelling. No visible canal hematoma. Disc levels: Mild degenerative changes of the mid cervical spine. Spinal canal is patent. Upper chest: Visualized lung apices are notable for biapical pleural-parenchymal scarring and mild centrilobular and paraseptal emphysematous changes. Other: Visualized thyroid is notable for a 14 mm right thyroid nodule. Not clinically significant; no follow-up imaging recommended (ref: J Am Coll Radiol. 2015 Feb;12(2): 143-50). IMPRESSION: No evidence of acute intracranial abnormality. Mild small vessel ischemic changes. No evidence of traumatic injury to the cervical spine. Mild degenerative changes. Electronically Signed   By: Charline Bills M.D.   On: 02/10/2021 01:33   CT Lumbar Spine Wo Contrast  Result Date: 02/10/2021 CLINICAL DATA:  Fall, low back pain EXAM: CT LUMBAR SPINE WITHOUT CONTRAST TECHNIQUE: Multidetector CT imaging of the lumbar spine was performed without  intravenous contrast administration. Multiplanar CT image reconstructions were also generated. COMPARISON:  None. FINDINGS: Segmentation: 5 lumbar-type vertebral bodies. Alignment: Normal lumbar lordosis. Vertebrae: Mild to moderate superior endplate compression fracture deformity at L1, with approximately 40% loss of height (sagittal image 37), and minimal retropulsion (sagittal image 35). Vertebral body heights are otherwise maintained. Inferior endplate Schmorl's node deformity at L5 (sagittal image 38). Paraspinal and other soft tissues: Mild atherosclerotic calcifications of the aortic arch and iliac arteries. Otherwise unremarkable. Disc levels: Mild degenerative changes at L2-3 and L5-S1. IMPRESSION: Mild to moderate superior endplate compression fracture deformity at L1, with approximately 40% loss of height, and minimal retropulsion. Electronically Signed   By: Charline Bills M.D.   On: 02/10/2021 01:36   CT Hip Right Wo Contrast  Result Date: 02/10/2021 CLINICAL DATA:  Fall, right hip pain EXAM: CT OF THE RIGHT HIP WITHOUT CONTRAST TECHNIQUE: Multidetector CT imaging of the right hip was performed according to the standard protocol. Multiplanar CT image reconstructions were also generated. COMPARISON:  Right femur radiographs dated 02/09/2021 FINDINGS: Subcapital right hip fracture, impacted, with foreshortening. Mild subcutaneous stranding overlying the greater trochanter (series 5/image 64). No discrete hematoma. Visualized bony pelvis is intact. Right hip joint space is preserved. Soft tissue pelvis is unremarkable.  No pelvic ascites. IMPRESSION: Impacted subcapital right hip fracture, as above. Electronically Signed   By: Charline Bills M.D.   On: 02/10/2021 01:41   DG C-Arm 1-60 Min-No Report  Result Date: 02/12/2021 CLINICAL DATA:  L1 kyphoplasty EXAM: THORACOLUMBAR SPINE 1V COMPARISON:  Thoracic and lumbar radiographs 02/09/2021 FINDINGS: AP and lateral C-arm images were obtained  centered at L1. Bilateral cement vertebral augmentation at L1 for fracture. Mild vertebral body height loss. Chronic fracture of T11 unchanged from prior studies. IMPRESSION: Bilateral kyphoplasty L1 vertebral body. Electronically Signed   By: Marlan Palau M.D.   On: 02/12/2021 16:01   DG C-Arm 1-60 Min-No Report  Result Date: 02/10/2021 Fluoroscopy was utilized by the requesting physician.  No radiographic interpretation.   DG HIP OPERATIVE UNILAT WITH PELVIS RIGHT  Result Date: 02/10/2021 CLINICAL DATA:  Right hip fracture EXAM: OPERATIVE right HIP (WITH PELVIS IF PERFORMED) 2 VIEWS TECHNIQUE: Fluoroscopic spot image(s) were submitted for interpretation post-operatively. COMPARISON:  02/09/2021 FINDINGS: Two low resolution intraoperative spot views of the right hip. Total fluoroscopy time was 1 minutes 38 seconds. The images demonstrate threaded screw fixation of right femoral neck fracture. IMPRESSION: Intraoperative fluoroscopic assistance provided during surgical fixation of right hip fracture Electronically Signed   By: Jasmine Pang M.D.   On: 02/10/2021 20:17   DG Femur Min 2 Views Right  Result Date: 02/09/2021 CLINICAL DATA:  Status post  fall. EXAM: RIGHT FEMUR 2 VIEWS COMPARISON:  None. FINDINGS: An impacted fracture deformity of indeterminate age is seen involving the junction of the superior aspect of the right femoral head and neck. There is no evidence of dislocation. Degenerative changes are seen in the form of joint space narrowing and acetabular sclerosis. Soft tissues are unremarkable. IMPRESSION: 1. Impacted fracture deformity of the right femoral head and neck. CT correlation is recommended. Electronically Signed   By: Aram Candelahaddeus  Houston M.D.   On: 02/09/2021 22:22    S/p right hip percutaneous pinning , lumbar 1 kyphoplasty   Subjective: Patient was seen and examined at bedside.  Overnight events noted.  Patient reports pain is better controlled.  Patient wants to be  discharged for rehab.  Patient is being discharged  Discharge Exam: Vitals:   02/16/21 1943 02/17/21 0719  BP: (!) 111/45 (!) 109/55  Pulse: 77 87  Resp: 16 15  Temp: 98.3 F (36.8 C) 97.6 F (36.4 C)  SpO2: 97% 96%   Vitals:   02/16/21 1429 02/16/21 1700 02/16/21 1943 02/17/21 0719  BP: (!) 114/53  (!) 111/45 (!) 109/55  Pulse: 100  77 87  Resp:  18 16 15   Temp: 97.7 F (36.5 C)  98.3 F (36.8 C) 97.6 F (36.4 C)  TempSrc: Oral  Oral Oral  SpO2: 97%  97% 96%  Weight:      Height:        General: Pt is alert, awake, not in acute distress Cardiovascular: RRR, S1/S2 +, no rubs, no gallops Respiratory: CTA bilaterally, no wheezing, no rhonchi Abdominal: Soft, NT, ND, bowel sounds + Extremities: Right Hip tenderness+, dressing noted.    The results of significant diagnostics from this hospitalization (including imaging, microbiology, ancillary and laboratory) are listed below for reference.     Microbiology: Recent Results (from the past 240 hour(s))  Resp Panel by RT-PCR (Flu A&B, Covid) Nasopharyngeal Swab     Status: None   Collection Time: 02/10/21 12:03 AM   Specimen: Nasopharyngeal Swab; Nasopharyngeal(NP) swabs in vial transport medium  Result Value Ref Range Status   SARS Coronavirus 2 by RT PCR NEGATIVE NEGATIVE Final    Comment: (NOTE) SARS-CoV-2 target nucleic acids are NOT DETECTED.  The SARS-CoV-2 RNA is generally detectable in upper respiratory specimens during the acute phase of infection. The lowest concentration of SARS-CoV-2 viral copies this assay can detect is 138 copies/mL. A negative result does not preclude SARS-Cov-2 infection and should not be used as the sole basis for treatment or other patient management decisions. A negative result may occur with  improper specimen collection/handling, submission of specimen other than nasopharyngeal swab, presence of viral mutation(s) within the areas targeted by this assay, and inadequate number of  viral copies(<138 copies/mL). A negative result must be combined with clinical observations, patient history, and epidemiological information. The expected result is Negative.  Fact Sheet for Patients:  BloggerCourse.comhttps://www.fda.gov/media/152166/download  Fact Sheet for Healthcare Providers:  SeriousBroker.ithttps://www.fda.gov/media/152162/download  This test is no t yet approved or cleared by the [place] FDA and  has been authorized for detection and/or diagnosis of SARS-CoV-2 by FDA under an Emergency Use Authorization (EUA). This EUA will remain  in effect (meaning this test can be used) for the duration of the COVID-19 declaration under Section 564(b)(1) of the Act, 21 U.S.C.section 360bbb-3(b)(1), unless the authorization is terminated  or revoked sooner.       Influenza A by PCR NEGATIVE NEGATIVE Final   Influenza B by PCR NEGATIVE NEGATIVE  Final    Comment: (NOTE) The Xpert Xpress SARS-CoV-2/FLU/RSV plus assay is intended as an aid in the diagnosis of influenza from Nasopharyngeal swab specimens and should not be used as a sole basis for treatment. Nasal washings and aspirates are unacceptable for Xpert Xpress SARS-CoV-2/FLU/RSV testing.  Fact Sheet for Patients: BloggerCourse.com  Fact Sheet for Healthcare Providers: SeriousBroker.it  This test is not yet approved or cleared by the [place] FDA and has been authorized for detection and/or diagnosis of SARS-CoV-2 by FDA under an Emergency Use Authorization (EUA). This EUA will remain in effect (meaning this test can be used) for the duration of the COVID-19 declaration under Section 564(b)(1) of the Act, 21 U.S.C. section 360bbb-3(b)(1), unless the authorization is terminated or revoked.  Performed at [facility], [address] [place], [place] [address]   Surgical pcr screen     Status: None   Collection Time: 02/10/21  6:16 PM   Specimen: Nasal Mucosa; Nasal Swab   Result Value Ref Range Status   MRSA, PCR NEGATIVE NEGATIVE Final   Staphylococcus aureus NEGATIVE NEGATIVE Final    Comment: (NOTE) The Xpert SA Assay (FDA approved for NASAL specimens in patients 80 years of age and older), is one component of a comprehensive surveillance program. It is not intended to diagnose infection nor to guide or monitor treatment. Performed at [facility], [address], [place], [place] [address]      Labs: BNP (last 3 results) No results for input(s): BNP in the last 8760 hours. Basic Metabolic Panel: Recent Labs  Lab 02/11/21 0255  NA 136  K 3.8  CL 103  CO2 22  GLUCOSE 148*  BUN 6*  CREATININE 0.52  CALCIUM 8.8*   Liver Function Tests: No results for input(s): AST, ALT, ALKPHOS, BILITOT, PROT, ALBUMIN in the last 168 hours. No results for input(s): LIPASE, AMYLASE in the last 168 hours. No results for input(s): AMMONIA in the last 168 hours. CBC: Recent Labs  Lab 02/11/21 0255 02/12/21 0055  WBC 9.1 8.8  HGB 12.4 12.0  HCT 37.4 35.4*  MCV 99.5 98.3  PLT 161 156   Cardiac Enzymes: No results for input(s): CKTOTAL, CKMB, CKMBINDEX, TROPONINI in the last 168 hours. BNP: Invalid input(s): POCBNP CBG: No results for input(s): GLUCAP in the last 168 hours. D-Dimer No results for input(s): DDIMER in the last 72 hours. Hgb A1c No results for input(s): HGBA1C in the last 72 hours. Lipid Profile No results for input(s): CHOL, HDL, LDLCALC, TRIG, CHOLHDL, LDLDIRECT in the last 72 hours. Thyroid function studies No results for input(s): TSH, T4TOTAL, T3FREE, THYROIDAB in the last 72 hours.  Invalid input(s): FREET3 Anemia work up No results for input(s): VITAMINB12, FOLATE, FERRITIN, TIBC, IRON, RETICCTPCT in the last 72 hours. Urinalysis No results found for: COLORURINE, APPEARANCEUR, LABSPEC, PHURINE, GLUCOSEU, HGBUR, BILIRUBINUR, KETONESUR, PROTEINUR, UROBILINOGEN, NITRITE, LEUKOCYTESUR Sepsis Labs Invalid input(s):  PROCALCITONIN,  WBC,  LACTICIDVEN Microbiology Recent Results (from the past 240 hour(s))  Resp Panel by RT-PCR (Flu A&B, Covid) Nasopharyngeal Swab     Status: None   Collection Time: 02/10/21 12:03 AM   Specimen: Nasopharyngeal Swab; Nasopharyngeal(NP) swabs in vial transport medium  Result Value Ref Range Status   SARS Coronavirus 2 by RT PCR NEGATIVE NEGATIVE Final    Comment: (NOTE) SARS-CoV-2 target nucleic acids are NOT DETECTED.  The SARS-CoV-2 RNA is generally detectable in upper respiratory specimens during the acute phase of infection. The lowest concentration of SARS-CoV-2 viral  copies this assay can detect is 138 copies/mL. A negative result does not preclude SARS-Cov-2 infection and should not be used as the sole basis for treatment or other patient management decisions. A negative result may occur with  improper specimen collection/handling, submission of specimen other than nasopharyngeal swab, presence of viral mutation(s) within the areas targeted by this assay, and inadequate number of viral copies(<138 copies/mL). A negative result must be combined with clinical observations, patient history, and epidemiological information. The expected result is Negative.  Fact Sheet for Patients:  BloggerCourse.com  Fact Sheet for Healthcare Providers:  SeriousBroker.it  This test is no t yet approved or cleared by the [place] FDA and  has been authorized for detection and/or diagnosis of SARS-CoV-2 by FDA under an Emergency Use Authorization (EUA). This EUA will remain  in effect (meaning this test can be used) for the duration of the COVID-19 declaration under Section 564(b)(1) of the Act, 21 U.S.C.section 360bbb-3(b)(1), unless the authorization is terminated  or revoked sooner.       Influenza A by PCR NEGATIVE NEGATIVE Final   Influenza B by PCR NEGATIVE NEGATIVE Final    Comment: (NOTE) The Xpert Xpress  SARS-CoV-2/FLU/RSV plus assay is intended as an aid in the diagnosis of influenza from Nasopharyngeal swab specimens and should not be used as a sole basis for treatment. Nasal washings and aspirates are unacceptable for Xpert Xpress SARS-CoV-2/FLU/RSV testing.  Fact Sheet for Patients: BloggerCourse.com  Fact Sheet for Healthcare Providers: SeriousBroker.it  This test is not yet approved or cleared by the [place] FDA and has been authorized for detection and/or diagnosis of SARS-CoV-2 by FDA under an Emergency Use Authorization (EUA). This EUA will remain in effect (meaning this test can be used) for the duration of the COVID-19 declaration under Section 564(b)(1) of the Act, 21 U.S.C. section 360bbb-3(b)(1), unless the authorization is terminated or revoked.  Performed at [facility], [address] [place], [place] [address]   Surgical pcr screen     Status: None   Collection Time: 02/10/21  6:16 PM   Specimen: Nasal Mucosa; Nasal Swab  Result Value Ref Range Status   MRSA, PCR NEGATIVE NEGATIVE Final   Staphylococcus aureus NEGATIVE NEGATIVE Final    Comment: (NOTE) The Xpert SA Assay (FDA approved for NASAL specimens in patients 29 years of age and older), is one component of a comprehensive surveillance program. It is not intended to diagnose infection nor to guide or monitor treatment. Performed at [facility], [address], [place], [place] [address]      Time coordinating discharge: Over 30 minutes  SIGNED:   Cipriano Bunker, MD  [facility] 02/17/2021, 11:06 AM Pager   If 7PM-7AM, please contact night-coverage

## 2021-02-17 NOTE — Progress Notes (Signed)
Pt report given to Anne,receiving RN for [facility] SN[facility].Pt is gonna be assigned to room 402.

## 2021-02-17 NOTE — Progress Notes (Signed)
   Subjective: 5 Days Post-Op Procedure(s) (LRB): LUMBAR ONE KYPHOPLASTY (N/A)  S/p right hip percutaneous pinning Mild weakness and spasms to right hip Lumbar spine feeling much better with less pain Denies any new symptoms or issues Patient reports pain as mild.  Objective:   VITALS:   Vitals:   02/16/21 1943 02/17/21 0719  BP: (!) 111/45 (!) 109/55  Pulse: 77 87  Resp: 16 15  Temp: 98.3 F (36.8 C) 97.6 F (36.4 C)  SpO2: 97% 96%    Right hip/leg incisions healing well Nv intact distally No rashes or edema distally Mild guarding with rom  LABS No results for input(s): HGB, HCT, WBC, PLT in the last 72 hours.  No results for input(s): NA, K, BUN, CREATININE, GLUCOSE in the last 72 hours.   Assessment/Plan: 5 Days Post-Op Procedure(s) (LRB): LUMBAR ONE KYPHOPLASTY (N/A) S/p right hip percutaneous pinning Continue current treatment with PT/OT D/c planning to SNF - stable from orthopedic standpoint for d/c Pain management Will continue to monitor her progress     Alphonsa Overall PA-C, MPAS [facility] is now [facility] [address], [place], [place] [address] Phone: [phone] [email or web address] Facebook  [employer]

## 2021-02-17 NOTE — TOC Transition Note (Signed)
Transition of Care [facility]) - CM/SW Discharge Note   Patient Details  Name: Carrie Martinez MRN: [id number] Date of Birth: 05/10/1951  Transition of Care [facility]) CM/SW Contact:  Ralene Bathe, LCSWA Phone Number: 02/17/2021, 12:05 PM   Clinical Narrative:    Patient will DC to: [facility] Anticipated DC date: 02/17/2021 Family notified: Yes Transport by:  Sharin Mons   Per MD patient ready for DC to SNF. RN to call report prior to discharge [phone]. RN, patient, patient's family, and facility notified of DC. Discharge Summary and FL2 sent to facility. DC packet on chart. Ambulance transport will be requested for patient when COVID test results are received and RN reports that patient is ready.   CSW will sign off for now as social work intervention is no longer needed. Please consult [place] again if new needs arise.     Final next level of care: Skilled Nursing Facility Barriers to Discharge: Barriers Resolved   Patient Goals and CMS Choice Patient states their goals for this hospitalization and ongoing recovery are:: To get back to living independently CMS Medicare.gov Compare Post Acute Care list provided to:: Patient Choice offered to / list presented to : Patient, Adult Children  Discharge Placement              Patient chooses bed at:  [facility]) Patient to be transferred to facility by: [facility] Name of family member notified: Farina,Chris (Son)   [phone] Patient and family notified of of transfer: 02/17/21  Discharge Plan and Services                                     Social Determinants of Health (SDOH) Interventions     Readmission Risk Interventions No flowsheet data found.

## 2021-02-17 NOTE — Progress Notes (Signed)
Physical Therapy Treatment Patient Details Name: Carrie Martinez MRN: [id number] DOB: January 25, 1951 Today's Date: 02/17/2021    History of Present Illness 70 yo female admitted 02/10/21 after a fall at home. Received percutaneous pinning R hip 8/29, then received kyphoplasty for acute L1 compression fracture on 02/12/21.   PMH MS    PT Comments    Patient received in bed, pleasant and cooperative today. Progressing very well with her mobility but does still need quite a bit of assist to transition to standing while maintaining PWB precautions R LE. Tolerated progression of gait distance well today with RW/min guard, just very easily fatigued. Left up in recliner with all needs met, chair alarm active. Continue to recommend SNF.     Follow Up Recommendations  SNF;Supervision/Assistance - 24 hour     Equipment Recommendations  Rolling walker with 5" wheels;3in1 (PT);Wheelchair (measurements PT);Wheelchair cushion (measurements PT)    Recommendations for Other Services       Precautions / Restrictions Precautions Precautions: Fall Precaution Comments: Hx MS, new L1 kyphoplasty Required Braces or Orthoses:  (no brace) Restrictions Weight Bearing Restrictions: Yes RLE Weight Bearing: Partial weight bearing RLE Partial Weight Bearing Percentage or Pounds: 25-50%    Mobility  Bed Mobility Overal bed mobility: Needs Assistance Bed Mobility: Rolling;Sidelying to Sit Rolling: Min assist Sidelying to sit: Min guard;HOB elevated       General bed mobility comments: needed MinA to keep body in line when rolling, from there able to come to midline sitting at EOB with min guard/HOB elevated    Transfers Overall transfer level: Needs assistance Equipment used: Rolling walker (2 wheeled) Transfers: Sit to/from [facility] Sit to Stand: Mod assist Stand pivot transfers: Min assist       General transfer comment: ModA to boost all the way up to standing and gain balance  while maintaining WB precautions; only needed MinA to maintain balance while pivoting to recliner  Ambulation/Gait Ambulation/Gait assistance: Min guard Gait Distance (Feet): 8 Feet Assistive device: Rolling walker (2 wheeled) Gait Pattern/deviations: Step-to pattern;Decreased stride length;Decreased weight shift to right;Trunk flexed Gait velocity: reduced   General Gait Details: slow but steady with RW, needed close min guard for safety and able to maintain PWB precautions well but easily fatigued   Stairs             Wheelchair Mobility    Modified Rankin (Stroke Patients Only)       Balance Overall balance assessment: Needs assistance;History of Falls Sitting-balance support: Feet supported;Single extremity supported Sitting balance-Leahy Scale: Fair     Standing balance support: Bilateral upper extremity supported;During functional activity Standing balance-Leahy Scale: Poor Standing balance comment: reliant on BUE support                            Cognition Arousal/Alertness: Awake/alert Behavior During Therapy: WFL for tasks assessed/performed Overall Cognitive Status: Within Functional Limits for tasks assessed                                 General Comments: needs a bit of extra time for initiation as well as cues for sequencing, but able to maintain WB precautions well      Exercises      General Comments General comments (skin integrity, edema, etc.): able to maintain PWB precautions R LE well      Pertinent Vitals/Pain Pain Assessment: Faces Faces  Pain Scale: Hurts a little bit Pain Location: RLE Pain Descriptors / Indicators: Aching;Operative site guarding;Grimacing Pain Intervention(s): Limited activity within patient's tolerance;Monitored during session;Repositioned    Home Living                      Prior Function            PT Goals (current goals can now be found in the care plan section) Acute  Rehab PT Goals Patient Stated Goal: less pain PT Goal Formulation: With patient Time For Goal Achievement: 02/25/21 Potential to Achieve Goals: Fair Progress towards PT goals: Progressing toward goals    Frequency    Min 3X/week      PT Plan Current plan remains appropriate    Co-evaluation              AM-PAC PT "6 Clicks" Mobility   Outcome Measure  Help needed turning from your back to your side while in a flat bed without using bedrails?: A Little Help needed moving from lying on your back to sitting on the side of a flat bed without using bedrails?: A Lot Help needed moving to and from a bed to a chair (including a wheelchair)?: A Lot Help needed standing up from a chair using your arms (e.g., wheelchair or bedside chair)?: A Lot Help needed to walk in hospital room?: A Lot Help needed climbing 3-5 steps with a railing? : Total 6 Click Score: 12    End of Session Equipment Utilized During Treatment: Gait belt Activity Tolerance: Patient tolerated treatment well Patient left: in chair;with call bell/phone within reach;with chair alarm set Nurse Communication: Mobility status;Precautions PT Visit Diagnosis: Unsteadiness on feet (R26.81);Muscle weakness (generalized) (M62.81);Pain;Difficulty in walking, not elsewhere classified (R26.2) Pain - Right/Left: Right Pain - part of body: Hip     Time: 1156-1207 PT Time Calculation (min) (ACUTE ONLY): 11 min  Charges:  $Gait Training: 8-22 mins                    Windell Norfolk, DPT, PN2   Supplemental Physical Therapist [place]    Pager [phone] Acute Rehab Office [phone]

## 2021-02-18 DIAGNOSIS — S72002D Fracture of unspecified part of neck of left femur, subsequent encounter for closed fracture with routine healing: Secondary | ICD-10-CM | POA: Diagnosis not present

## 2021-02-18 DIAGNOSIS — I342 Nonrheumatic mitral (valve) stenosis: Secondary | ICD-10-CM | POA: Diagnosis not present

## 2021-02-18 DIAGNOSIS — S32010D Wedge compression fracture of first lumbar vertebra, subsequent encounter for fracture with routine healing: Secondary | ICD-10-CM | POA: Diagnosis not present

## 2021-02-19 DIAGNOSIS — S72001D Fracture of unspecified part of neck of right femur, subsequent encounter for closed fracture with routine healing: Secondary | ICD-10-CM | POA: Diagnosis not present

## 2021-02-19 DIAGNOSIS — L539 Erythematous condition, unspecified: Secondary | ICD-10-CM | POA: Diagnosis not present

## 2021-02-19 DIAGNOSIS — M25551 Pain in right hip: Secondary | ICD-10-CM | POA: Diagnosis not present

## 2021-02-20 DIAGNOSIS — D649 Anemia, unspecified: Secondary | ICD-10-CM | POA: Diagnosis not present

## 2021-02-24 DIAGNOSIS — S32000A Wedge compression fracture of unspecified lumbar vertebra, initial encounter for closed fracture: Secondary | ICD-10-CM | POA: Diagnosis not present

## 2021-02-25 DIAGNOSIS — M62838 Other muscle spasm: Secondary | ICD-10-CM | POA: Diagnosis not present

## 2021-02-25 DIAGNOSIS — M25561 Pain in right knee: Secondary | ICD-10-CM | POA: Diagnosis not present

## 2021-02-25 DIAGNOSIS — M4856XA Collapsed vertebra, not elsewhere classified, lumbar region, initial encounter for fracture: Secondary | ICD-10-CM | POA: Diagnosis not present

## 2021-02-25 DIAGNOSIS — M545 Low back pain, unspecified: Secondary | ICD-10-CM | POA: Diagnosis not present

## 2021-02-25 DIAGNOSIS — S72001D Fracture of unspecified part of neck of right femur, subsequent encounter for closed fracture with routine healing: Secondary | ICD-10-CM | POA: Diagnosis not present

## 2021-02-25 DIAGNOSIS — M25551 Pain in right hip: Secondary | ICD-10-CM | POA: Diagnosis not present

## 2021-02-26 DIAGNOSIS — Z471 Aftercare following joint replacement surgery: Secondary | ICD-10-CM | POA: Diagnosis not present

## 2021-02-26 DIAGNOSIS — S72011A Unspecified intracapsular fracture of right femur, initial encounter for closed fracture: Secondary | ICD-10-CM | POA: Diagnosis not present

## 2021-02-28 DIAGNOSIS — S72001D Fracture of unspecified part of neck of right femur, subsequent encounter for closed fracture with routine healing: Secondary | ICD-10-CM | POA: Diagnosis not present

## 2021-03-12 DIAGNOSIS — E785 Hyperlipidemia, unspecified: Secondary | ICD-10-CM | POA: Diagnosis not present

## 2021-03-12 DIAGNOSIS — G35 Multiple sclerosis: Secondary | ICD-10-CM | POA: Diagnosis not present

## 2021-03-12 DIAGNOSIS — S72001D Fracture of unspecified part of neck of right femur, subsequent encounter for closed fracture with routine healing: Secondary | ICD-10-CM | POA: Diagnosis not present

## 2021-03-17 DIAGNOSIS — S32000A Wedge compression fracture of unspecified lumbar vertebra, initial encounter for closed fracture: Secondary | ICD-10-CM | POA: Diagnosis not present

## 2021-03-17 DIAGNOSIS — R531 Weakness: Secondary | ICD-10-CM | POA: Diagnosis not present

## 2021-03-17 DIAGNOSIS — S72001A Fracture of unspecified part of neck of right femur, initial encounter for closed fracture: Secondary | ICD-10-CM | POA: Diagnosis not present

## 2021-03-17 DIAGNOSIS — S72001D Fracture of unspecified part of neck of right femur, subsequent encounter for closed fracture with routine healing: Secondary | ICD-10-CM | POA: Diagnosis not present

## 2021-03-21 DIAGNOSIS — S72011D Unspecified intracapsular fracture of right femur, subsequent encounter for closed fracture with routine healing: Secondary | ICD-10-CM | POA: Diagnosis not present

## 2021-03-21 DIAGNOSIS — E785 Hyperlipidemia, unspecified: Secondary | ICD-10-CM | POA: Diagnosis not present

## 2021-03-21 DIAGNOSIS — G35 Multiple sclerosis: Secondary | ICD-10-CM | POA: Diagnosis not present

## 2021-03-21 DIAGNOSIS — S32018D Other fracture of first lumbar vertebra, subsequent encounter for fracture with routine healing: Secondary | ICD-10-CM | POA: Diagnosis not present

## 2021-03-21 DIAGNOSIS — Z9181 History of falling: Secondary | ICD-10-CM | POA: Diagnosis not present

## 2021-03-25 DIAGNOSIS — G35 Multiple sclerosis: Secondary | ICD-10-CM | POA: Diagnosis not present

## 2021-03-25 DIAGNOSIS — E785 Hyperlipidemia, unspecified: Secondary | ICD-10-CM | POA: Diagnosis not present

## 2021-03-25 DIAGNOSIS — S72001D Fracture of unspecified part of neck of right femur, subsequent encounter for closed fracture with routine healing: Secondary | ICD-10-CM | POA: Diagnosis not present

## 2021-03-25 DIAGNOSIS — Z4889 Encounter for other specified surgical aftercare: Secondary | ICD-10-CM | POA: Diagnosis not present

## 2021-03-26 DIAGNOSIS — Z4789 Encounter for other orthopedic aftercare: Secondary | ICD-10-CM | POA: Diagnosis not present

## 2021-04-01 DIAGNOSIS — E785 Hyperlipidemia, unspecified: Secondary | ICD-10-CM | POA: Diagnosis not present

## 2021-04-01 DIAGNOSIS — G35 Multiple sclerosis: Secondary | ICD-10-CM | POA: Diagnosis not present

## 2021-04-01 DIAGNOSIS — S32018D Other fracture of first lumbar vertebra, subsequent encounter for fracture with routine healing: Secondary | ICD-10-CM | POA: Diagnosis not present

## 2021-04-01 DIAGNOSIS — S72011D Unspecified intracapsular fracture of right femur, subsequent encounter for closed fracture with routine healing: Secondary | ICD-10-CM | POA: Diagnosis not present

## 2021-04-01 DIAGNOSIS — Z9181 History of falling: Secondary | ICD-10-CM | POA: Diagnosis not present

## 2021-04-15 DIAGNOSIS — G35 Multiple sclerosis: Secondary | ICD-10-CM | POA: Diagnosis not present

## 2021-04-15 DIAGNOSIS — S32018D Other fracture of first lumbar vertebra, subsequent encounter for fracture with routine healing: Secondary | ICD-10-CM | POA: Diagnosis not present

## 2021-04-15 DIAGNOSIS — E785 Hyperlipidemia, unspecified: Secondary | ICD-10-CM | POA: Diagnosis not present

## 2021-04-15 DIAGNOSIS — Z9181 History of falling: Secondary | ICD-10-CM | POA: Diagnosis not present

## 2021-04-15 DIAGNOSIS — S72011D Unspecified intracapsular fracture of right femur, subsequent encounter for closed fracture with routine healing: Secondary | ICD-10-CM | POA: Diagnosis not present

## 2021-04-25 DIAGNOSIS — E785 Hyperlipidemia, unspecified: Secondary | ICD-10-CM | POA: Diagnosis not present

## 2021-04-25 DIAGNOSIS — S72001D Fracture of unspecified part of neck of right femur, subsequent encounter for closed fracture with routine healing: Secondary | ICD-10-CM | POA: Diagnosis not present

## 2021-04-25 DIAGNOSIS — G35 Multiple sclerosis: Secondary | ICD-10-CM | POA: Diagnosis not present

## 2021-05-01 DIAGNOSIS — S32018D Other fracture of first lumbar vertebra, subsequent encounter for fracture with routine healing: Secondary | ICD-10-CM | POA: Diagnosis not present

## 2021-05-01 DIAGNOSIS — E785 Hyperlipidemia, unspecified: Secondary | ICD-10-CM | POA: Diagnosis not present

## 2021-05-01 DIAGNOSIS — Z9181 History of falling: Secondary | ICD-10-CM | POA: Diagnosis not present

## 2021-05-01 DIAGNOSIS — G35 Multiple sclerosis: Secondary | ICD-10-CM | POA: Diagnosis not present

## 2021-05-01 DIAGNOSIS — S72011D Unspecified intracapsular fracture of right femur, subsequent encounter for closed fracture with routine healing: Secondary | ICD-10-CM | POA: Diagnosis not present

## 2021-05-14 DIAGNOSIS — Z4789 Encounter for other orthopedic aftercare: Secondary | ICD-10-CM | POA: Diagnosis not present

## 2021-05-15 DIAGNOSIS — E785 Hyperlipidemia, unspecified: Secondary | ICD-10-CM | POA: Diagnosis not present

## 2021-05-15 DIAGNOSIS — Z9181 History of falling: Secondary | ICD-10-CM | POA: Diagnosis not present

## 2021-05-15 DIAGNOSIS — G35 Multiple sclerosis: Secondary | ICD-10-CM | POA: Diagnosis not present

## 2021-05-15 DIAGNOSIS — S32018D Other fracture of first lumbar vertebra, subsequent encounter for fracture with routine healing: Secondary | ICD-10-CM | POA: Diagnosis not present

## 2021-05-15 DIAGNOSIS — S72011D Unspecified intracapsular fracture of right femur, subsequent encounter for closed fracture with routine healing: Secondary | ICD-10-CM | POA: Diagnosis not present

## 2021-05-20 DIAGNOSIS — S72011D Unspecified intracapsular fracture of right femur, subsequent encounter for closed fracture with routine healing: Secondary | ICD-10-CM | POA: Diagnosis not present

## 2021-05-20 DIAGNOSIS — E785 Hyperlipidemia, unspecified: Secondary | ICD-10-CM | POA: Diagnosis not present

## 2021-05-20 DIAGNOSIS — M4856XD Collapsed vertebra, not elsewhere classified, lumbar region, subsequent encounter for fracture with routine healing: Secondary | ICD-10-CM | POA: Diagnosis not present

## 2021-05-20 DIAGNOSIS — G35 Multiple sclerosis: Secondary | ICD-10-CM | POA: Diagnosis not present

## 2021-05-20 DIAGNOSIS — Z9181 History of falling: Secondary | ICD-10-CM | POA: Diagnosis not present

## 2021-05-20 DIAGNOSIS — Z79891 Long term (current) use of opiate analgesic: Secondary | ICD-10-CM | POA: Diagnosis not present

## 2021-05-25 DIAGNOSIS — G35 Multiple sclerosis: Secondary | ICD-10-CM | POA: Diagnosis not present

## 2021-05-25 DIAGNOSIS — E785 Hyperlipidemia, unspecified: Secondary | ICD-10-CM | POA: Diagnosis not present

## 2021-05-25 DIAGNOSIS — S72001D Fracture of unspecified part of neck of right femur, subsequent encounter for closed fracture with routine healing: Secondary | ICD-10-CM | POA: Diagnosis not present

## 2021-06-06 DIAGNOSIS — G35 Multiple sclerosis: Principal | ICD-10-CM

## 2021-06-06 DIAGNOSIS — M792 Neuralgia and neuritis, unspecified: Principal | ICD-10-CM

## 2021-06-06 MED ORDER — LAMOTRIGINE 25 MG TABLET
ORAL_TABLET | ORAL | PRN refills | 30 days | Status: CP
Start: 2021-06-06 — End: 2022-06-06
  Filled 2021-06-09: qty 46, 30d supply, fill #0

## 2021-06-10 DIAGNOSIS — Z79891 Long term (current) use of opiate analgesic: Secondary | ICD-10-CM | POA: Diagnosis not present

## 2021-06-10 DIAGNOSIS — Z9181 History of falling: Secondary | ICD-10-CM | POA: Diagnosis not present

## 2021-06-10 DIAGNOSIS — S72011D Unspecified intracapsular fracture of right femur, subsequent encounter for closed fracture with routine healing: Secondary | ICD-10-CM | POA: Diagnosis not present

## 2021-06-10 DIAGNOSIS — G35 Multiple sclerosis: Secondary | ICD-10-CM | POA: Diagnosis not present

## 2021-06-10 DIAGNOSIS — E785 Hyperlipidemia, unspecified: Secondary | ICD-10-CM | POA: Diagnosis not present

## 2021-06-10 DIAGNOSIS — M4856XD Collapsed vertebra, not elsewhere classified, lumbar region, subsequent encounter for fracture with routine healing: Secondary | ICD-10-CM | POA: Diagnosis not present

## 2021-06-30 MED FILL — LAMOTRIGINE 25 MG TABLET: ORAL | 23 days supply | Qty: 46 | Fill #1

## 2021-07-16 ENCOUNTER — Ambulatory Visit
Admit: 2021-07-16 | Discharge: 2021-07-17 | Payer: PRIVATE HEALTH INSURANCE | Attending: Spinal Cord Injury Medicine | Primary: Spinal Cord Injury Medicine

## 2021-07-16 ENCOUNTER — Ambulatory Visit: Admit: 2021-07-16 | Discharge: 2021-07-17 | Payer: PRIVATE HEALTH INSURANCE | Attending: Neurology | Primary: Neurology

## 2021-07-16 MED ORDER — GABAPENTIN 100 MG CAPSULE
ORAL_CAPSULE | Freq: Three times a day (TID) | ORAL | 3 refills | 30 days | Status: CP
Start: 2021-07-16 — End: 2022-07-16
  Filled 2021-07-24: qty 90, 30d supply, fill #0

## 2021-07-16 MED ORDER — LAMOTRIGINE 25 MG TABLET
ORAL_TABLET | 5 refills | 0 days | Status: CP
Start: 2021-07-16 — End: ?
  Filled 2021-07-24: qty 180, 34d supply, fill #0

## 2021-07-16 MED ORDER — SUVOREXANT 5 MG TABLET
ORAL_TABLET | Freq: Every evening | ORAL | 1 refills | 30 days | Status: CP | PRN
Start: 2021-07-16 — End: ?
  Filled 2021-07-24: qty 30, 30d supply, fill #0

## 2021-07-17 DIAGNOSIS — G4700 Insomnia, unspecified: Principal | ICD-10-CM

## 2021-07-25 MED ORDER — VUMERITY 231 MG CAPSULE,DELAYED RELEASE
ORAL_CAPSULE | Freq: Two times a day (BID) | ORAL | 5 refills | 0 days | Status: CP
Start: 2021-07-25 — End: ?

## 2021-07-31 ENCOUNTER — Ambulatory Visit: Admit: 2021-07-31 | Discharge: 2021-08-08 | Payer: PRIVATE HEALTH INSURANCE

## 2021-07-31 ENCOUNTER — Ambulatory Visit: Admit: 2021-07-31 | Payer: PRIVATE HEALTH INSURANCE

## 2021-08-01 MED ORDER — BACLOFEN 10 MG TABLET
ORAL_TABLET | Freq: Two times a day (BID) | ORAL | 0 refills | 30 days | Status: CP
Start: 2021-08-01 — End: 2021-08-31

## 2021-08-08 MED ORDER — BACLOFEN 20 MG TABLET
ORAL_TABLET | Freq: Every evening | ORAL | 0 refills | 30 days | Status: CN
Start: 2021-08-08 — End: 2021-09-07

## 2021-08-22 ENCOUNTER — Emergency Department: Payer: PPO

## 2021-08-22 ENCOUNTER — Emergency Department
Admission: EM | Admit: 2021-08-22 | Discharge: 2021-08-22 | Disposition: A | Discharge status: Skilled Nursing Facility | Payer: PPO | Attending: Emergency Medicine | Admitting: Emergency Medicine

## 2021-08-22 ENCOUNTER — Encounter: Payer: Self-pay | Admitting: Emergency Medicine

## 2021-08-22 ENCOUNTER — Other Ambulatory Visit: Payer: Self-pay

## 2021-08-22 DIAGNOSIS — Z20822 Contact with and (suspected) exposure to covid-19: Secondary | ICD-10-CM | POA: Insufficient documentation

## 2021-08-22 DIAGNOSIS — R569 Unspecified convulsions: Secondary | ICD-10-CM | POA: Insufficient documentation

## 2021-08-22 DIAGNOSIS — N3000 Acute cystitis without hematuria: Secondary | ICD-10-CM | POA: Insufficient documentation

## 2021-08-22 LAB — CBC WITH DIFFERENTIAL/PLATELET
Abs Immature Granulocytes: 0.02 10*3/uL (ref 0.00–0.07)
Basophils Absolute: 0.1 10*3/uL (ref 0.0–0.1)
Basophils Relative: 1 %
Eosinophils Absolute: 0.3 10*3/uL (ref 0.0–0.5)
Eosinophils Relative: 5 %
HCT: 41.2 % (ref 36.0–46.0)
Hemoglobin: 13.5 g/dL (ref 12.0–15.0)
Immature Granulocytes: 0 %
Lymphocytes Relative: 20 %
Lymphs Abs: 1.3 10*3/uL (ref 0.7–4.0)
MCH: 32.8 pg (ref 26.0–34.0)
MCHC: 32.8 g/dL (ref 30.0–36.0)
MCV: 100 fL (ref 80.0–100.0)
Monocytes Absolute: 0.8 10*3/uL (ref 0.1–1.0)
Monocytes Relative: 11 %
Neutro Abs: 4.1 10*3/uL (ref 1.7–7.7)
Neutrophils Relative %: 63 %
Platelets: 384 10*3/uL (ref 150–400)
RBC: 4.12 MIL/uL (ref 3.87–5.11)
RDW: 13.1 % (ref 11.5–15.5)
WBC: 6.6 10*3/uL (ref 4.0–10.5)
nRBC: 0 % (ref 0.0–0.2)

## 2021-08-22 LAB — URINALYSIS, COMPLETE (UACMP) WITH MICROSCOPIC
Bacteria, UA: NONE SEEN
Bilirubin Urine: NEGATIVE
Glucose, UA: NEGATIVE mg/dL
Ketones, ur: 80 mg/dL — AB
Nitrite: NEGATIVE
Protein, ur: 100 mg/dL — AB
RBC / HPF: >50 RBC/hpf — ABNORMAL HIGH (ref 0–5)
Specific Gravity, Urine: 1.024 (ref 1.005–1.030)
WBC, UA: >50 WBC/hpf — ABNORMAL HIGH (ref 0–5)
pH: 6 (ref 5.0–8.0)

## 2021-08-22 LAB — COMPREHENSIVE METABOLIC PANEL
ALT: 9 U/L (ref 0–44)
AST: 19 U/L (ref 15–41)
Albumin: 3.4 g/dL — ABNORMAL LOW (ref 3.5–5.0)
Alkaline Phosphatase: 115 U/L (ref 38–126)
Anion gap: 12 (ref 5–15)
BUN: 21 mg/dL (ref 8–23)
CO2: 23 mmol/L (ref 22–32)
Calcium: 9.1 mg/dL (ref 8.9–10.3)
Chloride: 103 mmol/L (ref 98–111)
Creatinine, Ser: 0.51 mg/dL (ref 0.44–1.00)
GFR, Estimated: >60 mL/min (ref >60)
Glucose, Bld: 105 mg/dL — ABNORMAL HIGH (ref 70–99)
Potassium: 4.1 mmol/L (ref 3.5–5.1)
Sodium: 138 mmol/L (ref 135–145)
Total Bilirubin: 1.1 mg/dL (ref 0.3–1.2)
Total Protein: 6.7 g/dL (ref 6.5–8.1)

## 2021-08-22 LAB — RESP PANEL BY RT-PCR (FLU A&B, COVID) ARPGX2
Influenza A by PCR: NEGATIVE
Influenza B by PCR: NEGATIVE
SARS Coronavirus 2 by RT PCR: NEGATIVE

## 2021-08-22 LAB — TROPONIN I (HIGH SENSITIVITY): Troponin I (High Sensitivity): 4 ng/L (ref <18)

## 2021-08-22 LAB — MAGNESIUM: Magnesium: 2.2 mg/dL (ref 1.7–2.4)

## 2021-08-22 MED ORDER — LAMOTRIGINE 25 MG PO TABS
75.0000 mg | ORAL_TABLET | Freq: Two times a day (BID) | ORAL | Status: DC
Start: 2021-08-22 — End: 2021-08-23
  Administered 2021-08-22: 75 mg via ORAL
  Filled 2021-08-22: qty 3

## 2021-08-22 MED ORDER — CEPHALEXIN 500 MG PO CAPS: 500.0000 mg | ORAL_CAPSULE | Freq: Four times a day (QID) | ORAL | 0 refills | Status: AC

## 2021-08-22 MED ORDER — BACLOFEN 10 MG PO TABS
15.0000 mg | ORAL_TABLET | Freq: Three times a day (TID) | ORAL | Status: DC
  Administered 2021-08-22: 15 mg via ORAL
  Filled 2021-08-22 (×3): qty 1.5

## 2021-08-22 MED ORDER — GABAPENTIN 100 MG PO CAPS
100.0000 mg | ORAL_CAPSULE | Freq: Three times a day (TID) | ORAL | Status: DC
  Administered 2021-08-22: 100 mg via ORAL
  Filled 2021-08-22: qty 1

## 2021-08-22 MED ORDER — LACTATED RINGERS IV BOLUS
1000.0000 mL | Freq: Once | INTRAVENOUS | Status: AC
  Administered 2021-08-22: 1000 mL via INTRAVENOUS

## 2021-08-22 MED ORDER — DIAZEPAM 5 MG PO TABS: 2.5000 mg | ORAL_TABLET | Freq: Three times a day (TID) | ORAL | Status: DC | PRN

## 2021-08-22 MED ORDER — SODIUM CHLORIDE 0.9 % IV SOLN
1.0000 g | Freq: Once | INTRAVENOUS | Status: AC
Start: 2021-08-22 — End: 2021-08-22
  Administered 2021-08-22: 1 g via INTRAVENOUS

## 2021-08-22 NOTE — ED Notes (Signed)
Purewick removed as pt requested. Pt reports her son will not be able to pick her up tonight.  ?

## 2021-08-22 NOTE — ED Notes (Signed)
Pt declined offer of pain meds or repositioning to help with discomfort.  ?

## 2021-08-22 NOTE — ED Notes (Signed)
Pt alert and laying calmly on stretcher. Stretcher locked low. Rails up. Call bell within reach.  ?

## 2021-08-22 NOTE — ED Triage Notes (Signed)
Pt BIB [facility] from [facility] with reports of possible seizure. Pt reports she got a botox injection in her left knee. After that staff at the nursing home reports the patient was unresponsive and her eye were "moving back and fourth." Pt reports she felt sleepy and both her legs were numb. Per pt she is at her baseline. ?

## 2021-08-22 NOTE — ED Notes (Signed)
Pt had small BM and urinated in briefs. Peri care provided and new briefs applied. Pt given cup of water as requested and HOB adjusted. Purewick applied. Pt declined offer of food. Call bell within reach.  ?

## 2021-08-22 NOTE — ED Provider Notes (Signed)
? ?[facility] ?Provider Note ? ? ? Event Date/Time  ? First MD Initiated Contact with Patient 08/22/21 1736   ?  (approximate) ? ? ?History  ? ?Seizures ? ? ?HPI ? ?Carrie Martinez is a 71 y.o. 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  Patient states she has gotten Botox many times and received a couple of injections in her right calf area earlier today between 1 and 2 PM.  States that she remembers after this feeling sleepy does not remember anything else.  Also reach staff at facility states that they went to check on her after injection gave unresponsive.  They states she had some shaking in her extremities and was unresponsive for a few seconds.  This aborted without any interventions.  Patient states he feels back to baseline and does not have a headache, earache, sore throat, does not recall any cough, fevers, vomiting, diarrhea, urinary symptoms recently, or any changes in her balance or contracture in the right leg.  She is not sure when she last had a seizure.  She is compliant with all his medications. ?  ?Past Medical History:  ?Diagnosis Date  ? Multiple sclerosis (HCC)   ? ? ? ?Physical Exam  ?Triage Vital Signs: ?ED Triage Vitals  ?Enc Vitals Group  ?   BP   ?   Pulse   ?   Resp   ?   Temp   ?   Temp src   ?   SpO2   ?   Weight   ?   Height   ?   Head Circumference   ?   Peak Flow   ?   Pain Score   ?   Pain Loc   ?   Pain Edu?   ?   Excl. in GC?   ? ? ?Most recent vital signs: ?Vitals:  ? 08/22/21 2200 08/22/21 2230  ?BP: (!) 117/56   ?Pulse: 88 93  ?Resp: 13 15  ?Temp:    ?SpO2: 97% 98%  ? ? ?General: Awake, no distress.  ?CV:  Good peripheral perfusion.  2+ radial pulses.  No murmur. ?Resp:  Normal  effort.  Clear bilaterally. ?Abd:  No distention.  Soft. ?Other:  Dry extremities.  Patient is contracted in her right lower extremity which she states is chronic.  She otherwise is better strength in upper extremities.  Cranial nerves II to XII are grossly intact.  Sensation is intact to light touch throughout.  She has some weakness in the left leg but she states this is also baseline.  2+ DP pulses. ? ?No erythema induration streaking or ulcer on the right calf area where patient states she received Botox earlier today. ? ? ?ED Results / Procedures / Treatments  ?Labs ?(all labs ordered are listed, but only abnormal results are displayed) ?Labs Reviewed  ?COMPREHENSIVE METABOLIC PANEL - Abnormal; Notable for the following components:  ?    Result Value  ? Glucose, Bld 105 (*)   ? Albumin 3.4 (*)   ? All other components within normal limits  ?URINALYSIS, COMPLETE (UACMP) WITH MICROSCOPIC - Abnormal; Notable for the following components:  ? Color, Urine AMBER (*)   ? APPearance CLOUDY (*)   ? Hgb  urine dipstick LARGE (*)   ? Ketones, ur 80 (*)   ? Protein, ur 100 (*)   ? Leukocytes,Ua MODERATE (*)   ? RBC / HPF >50 (*)   ? WBC, UA >50 (*)   ? All other components within normal limits  ?RESP PANEL BY RT-PCR (FLU A&B, COVID) ARPGX2  ?URINE CULTURE  ?CBC WITH DIFFERENTIAL/PLATELET  ?MAGNESIUM  ?LAMOTRIGINE LEVEL  ?TROPONIN I (HIGH SENSITIVITY)  ? ? ? ?EKG ? ?EKG is remarkable sinus tachycardia with a ventricular rate of 116, normal axis, unremarkable intervals without clear evidence of acute ischemia or significant arrhythmia. ? ? ?RADIOLOGY ? ?CT, interpretation without evidence of hemorrhage, ischemia, mass effect, or acute process.  Also reviewed radiologist interpretation and agree with the findings of same as well as notation of some mild chronic matter disease. ? ? ?PROCEDURES: ? ?Critical Care performed: No ? ?.1-3 Lead EKG Interpretation ?Performed by: Gilles Chiquito, MD ?Authorized by: Gilles Chiquito, MD   ? ?  Interpretation: normal   ?  ECG rate assessment: tachycardic   ?  Rhythm: sinus rhythm   ?  Ectopy: none   ?  Conduction: normal   ? ?The patient is on the cardiac monitor to evaluate for evidence of arrhythmia and/or significant heart rate changes. ? ? ?MEDICATIONS ORDERED IN ED: ?Medications  ?baclofen (LIORESAL) tablet 15 mg (15 mg Oral Given 08/22/21 2200)  ?gabapentin (NEURONTIN) capsule 100 mg (100 mg Oral Given 08/22/21 2201)  ?lamoTRIgine (LAMICTAL) tablet 75 mg (75 mg Oral Given 08/22/21 2201)  ?diazepam (VALIUM) tablet 2.5 mg (has no administration in time range)  ?cefTRIAXone (ROCEPHIN) 1 g in sodium chloride 0.9 % 100 mL IVPB (has no administration in time range)  ?lactated ringers bolus 1,000 mL (0 mLs Intravenous Stopped 08/22/21 1930)  ? ? ? ?IMPRESSION / MDM / ASSESSMENT AND PLAN / ED COURSE  ?I reviewed the triage vital signs and the nursing notes. ?             ?               ? ?Differential diagnosis includes, but is not limited to seizure, convulsive syncope secondary to arrhythmia, metabolic derangement or toxic effect, due to this. ? ?CT head is negative.  No findings on exam to suggest acute CVA.  ECG shows no evidence of arrhythmia or ischemia and troponin not elevated and also not suggestive of an acute cardiac ischemic event.  CMP shows no significant electrolyte or metabolic derangements.  CBC shows no leukocytosis or acute anemia.  Magnesium is within normal limits. ? ? ?UA remarkable for hemoglobin, ketones, protein and moderate leukocyte esterase with greater than 50 RBCs and WBCs concerning for possible cystitis.  Patient presents with urinary symptoms we will treat with a course of Keflex for assessment emergency room.  Urine culture sent.  COVID PCR negative. ? ?Patient appearing slightly dry slightly tachycardic on arrival otherwise reassuring exam without any evidence of altered mental status, neurological deficits or infectious process or seizure-like activity.  She states she  feels at baseline.  Overall unclear etiology for events earlier today there was a seizure or something else although given no recurrence over 4 hours in the emergency room patient continuing states she feels like she is in her usual state health otherwise reassuring exam work-up I think she is safe for discharge with continued outpatient evaluation.  We will discontinue tramadol as it  could lower seizure threshold.  She was given home doses  of her baclofen and seizure medications. ? ?Rx written for Discharge.  Strict return precautions discussed. ? ?  ? ? ?FINAL CLINICAL IMPRESSION(S) / ED DIAGNOSES  ? ?Final diagnoses:  ?Seizure-like activity (HCC)  ?Acute cystitis without hematuria  ? ? ? ?Rx / DC Orders  ? ?ED Discharge Orders   ? ?      Ordered  ?  cephALEXin (KEFLEX) 500 MG capsule  4 times daily       ? 08/22/21 2236  ? ?  ?  ? ?  ? ? ? ?Note:  This document was prepared using Dragon voice recognition software and may include unintentional dictation errors. ?  ?Gilles Chiquito, MD ?08/22/21 2237 ? ?

## 2021-08-22 NOTE — ED Notes (Signed)
Secretary notified pt will need EMS ride home to [facility] if d/c.  ?

## 2021-08-22 NOTE — ED Notes (Signed)
EMS notified for transport 

## 2021-08-24 LAB — URINE CULTURE: Culture: <10000 — AB

## 2021-08-25 LAB — LAMOTRIGINE LEVEL: Lamotrigine Lvl: 2.3 ug/mL (ref 2.0–20.0)

## 2021-09-06 DIAGNOSIS — G35 Multiple sclerosis: Principal | ICD-10-CM

## 2021-09-09 DIAGNOSIS — G4700 Insomnia, unspecified: Principal | ICD-10-CM

## 2021-09-09 MED ORDER — SUVOREXANT 5 MG TABLET
ORAL_TABLET | Freq: Every evening | ORAL | 1 refills | 30 days | PRN
Start: 2021-09-09 — End: ?

## 2021-09-09 MED ORDER — DULOXETINE 60 MG CAPSULE,DELAYED RELEASE
ORAL_CAPSULE | 0 refills | 0.00000 days
Start: 2021-09-09 — End: ?

## 2021-09-09 MED ORDER — GABAPENTIN 100 MG CAPSULE
ORAL_CAPSULE | Freq: Three times a day (TID) | ORAL | 3 refills | 30 days
Start: 2021-09-09 — End: 2022-09-09

## 2021-09-11 MED ORDER — DULOXETINE 60 MG CAPSULE,DELAYED RELEASE
ORAL_CAPSULE | Freq: Every day | ORAL | 0 refills | 0.00000 days | Status: CP
Start: 2021-09-11 — End: ?

## 2021-09-11 MED ORDER — SUVOREXANT 5 MG TABLET
ORAL_TABLET | Freq: Every evening | ORAL | 1 refills | 30 days | Status: CP | PRN
Start: 2021-09-11 — End: ?
  Filled 2021-09-16: qty 30, 30d supply, fill #0

## 2021-09-11 MED ORDER — GABAPENTIN 100 MG CAPSULE
ORAL_CAPSULE | Freq: Three times a day (TID) | ORAL | 3 refills | 30 days | Status: CP
Start: 2021-09-11 — End: 2022-09-11
  Filled 2021-09-16: qty 180, 60d supply, fill #0

## 2021-09-13 MED ORDER — LAMOTRIGINE 25 MG TABLET
ORAL_TABLET | Freq: Two times a day (BID) | ORAL | 0 refills | 30 days | Status: CP
Start: 2021-09-13 — End: 2021-10-13

## 2021-09-13 MED ORDER — MELATONIN 3 MG TABLET
ORAL_TABLET | Freq: Every evening | ORAL | 11 refills | 30 days | Status: CP
Start: 2021-09-13 — End: ?

## 2021-09-13 MED ORDER — BACLOFEN 10 MG TABLET
ORAL_TABLET | ORAL | 0 refills | 30 days | Status: CP
Start: 2021-09-13 — End: 2021-10-13

## 2021-09-15 DIAGNOSIS — G35 Multiple sclerosis: Principal | ICD-10-CM

## 2021-09-15 DIAGNOSIS — G4700 Insomnia, unspecified: Principal | ICD-10-CM

## 2021-09-15 MED ORDER — MELATONIN 3 MG TABLET
ORAL_TABLET | Freq: Every evening | ORAL | 11 refills | 30 days | Status: CP
Start: 2021-09-15 — End: ?
  Filled 2021-09-16: qty 90, 90d supply, fill #0

## 2021-09-15 MED ORDER — DULOXETINE 60 MG CAPSULE,DELAYED RELEASE
ORAL_CAPSULE | Freq: Every day | ORAL | 1 refills | 90 days | Status: CP
Start: 2021-09-15 — End: ?
  Filled 2021-09-16: qty 90, 90d supply, fill #0

## 2021-09-15 MED ORDER — BACLOFEN 10 MG TABLET
ORAL_TABLET | ORAL | 0 refills | 30 days | Status: CP
Start: 2021-09-15 — End: 2021-10-15
  Filled 2021-09-16: qty 120, 30d supply, fill #0

## 2021-09-15 MED ORDER — LAMOTRIGINE 25 MG TABLET
ORAL_TABLET | Freq: Two times a day (BID) | ORAL | 0 refills | 30 days | Status: CP
Start: 2021-09-15 — End: 2021-10-15
  Filled 2021-09-16: qty 180, 30d supply, fill #0

## 2021-10-23 DIAGNOSIS — G959 Disease of spinal cord, unspecified: Principal | ICD-10-CM

## 2021-10-23 DIAGNOSIS — G35 Multiple sclerosis: Principal | ICD-10-CM

## 2021-10-23 DIAGNOSIS — Z7952 Long term (current) use of systemic steroids: Principal | ICD-10-CM

## 2021-10-23 DIAGNOSIS — E559 Vitamin D deficiency, unspecified: Principal | ICD-10-CM

## 2021-10-23 MED ORDER — CALCIUM CARBONATE 600 MG CALCIUM (1,500 MG) TABLET
ORAL_TABLET | Freq: Every day | ORAL | PRN refills | 30 days
Start: 2021-10-23 — End: ?

## 2021-10-23 MED ORDER — LAMOTRIGINE 25 MG TABLET
ORAL_TABLET | Freq: Two times a day (BID) | ORAL | 0 refills | 30 days
Start: 2021-10-23 — End: 2021-11-22

## 2021-10-23 MED ORDER — CHOLECALCIFEROL (VITAMIN D3) 100 MCG (4,000 UNIT) TABLET
ORAL_TABLET | Freq: Every day | ORAL | 11 refills | 30 days | Status: CP
Start: 2021-10-23 — End: ?

## 2021-10-23 MED ORDER — BACLOFEN 10 MG TABLET
ORAL_TABLET | ORAL | 0 refills | 30 days
Start: 2021-10-23 — End: 2021-11-22

## 2021-10-24 DIAGNOSIS — E559 Vitamin D deficiency, unspecified: Principal | ICD-10-CM

## 2021-10-24 MED ORDER — LAMOTRIGINE 25 MG TABLET
ORAL_TABLET | Freq: Two times a day (BID) | ORAL | 0 refills | 30 days | Status: CP
Start: 2021-10-24 — End: 2021-11-23
  Filled 2021-10-27: qty 180, 30d supply, fill #0

## 2021-10-24 MED ORDER — CHOLECALCIFEROL (VITAMIN D3) 100 MCG (4,000 UNIT) TABLET
ORAL_TABLET | Freq: Every day | ORAL | 11 refills | 30 days | Status: CP
Start: 2021-10-24 — End: ?

## 2021-10-24 MED ORDER — BACLOFEN 10 MG TABLET
ORAL_TABLET | ORAL | 0 refills | 30 days | Status: CP
Start: 2021-10-24 — End: 2021-11-23
  Filled 2021-10-27: qty 120, 30d supply, fill #0

## 2021-10-27 DIAGNOSIS — R5383 Other fatigue: Principal | ICD-10-CM

## 2021-10-27 DIAGNOSIS — G35 Multiple sclerosis: Principal | ICD-10-CM

## 2021-10-27 MED FILL — BELSOMRA 5 MG TABLET: ORAL | 30 days supply | Qty: 30 | Fill #1

## 2021-10-28 DIAGNOSIS — G959 Disease of spinal cord, unspecified: Principal | ICD-10-CM

## 2021-10-28 DIAGNOSIS — Z7952 Long term (current) use of systemic steroids: Principal | ICD-10-CM

## 2021-10-28 MED ORDER — CALCIUM CARBONATE 600 MG CALCIUM (1,500 MG) TABLET
ORAL_TABLET | Freq: Every day | ORAL | PRN refills | 30.00000 days | Status: CP
Start: 2021-10-28 — End: ?

## 2021-10-29 MED ORDER — CALCIUM CARBONATE 600 MG CALCIUM (1,500 MG) TABLET
ORAL_TABLET | Freq: Every day | ORAL | PRN refills | 30 days
Start: 2021-10-29 — End: ?

## 2021-11-03 DIAGNOSIS — Z7952 Long term (current) use of systemic steroids: Principal | ICD-10-CM

## 2021-11-03 DIAGNOSIS — G959 Disease of spinal cord, unspecified: Principal | ICD-10-CM

## 2021-11-03 MED ORDER — CALCIUM CARBONATE 600 MG CALCIUM (1,500 MG) TABLET
ORAL_TABLET | Freq: Every day | ORAL | 5 refills | 30 days | Status: CP
Start: 2021-11-03 — End: ?
  Filled 2021-11-05: qty 30, 30d supply, fill #0

## 2021-11-12 ENCOUNTER — Ambulatory Visit: Admit: 2021-11-12 | Discharge: 2021-11-12 | Payer: PRIVATE HEALTH INSURANCE | Attending: Neurology | Primary: Neurology

## 2021-11-12 ENCOUNTER — Ambulatory Visit: Admit: 2021-11-12 | Discharge: 2021-11-12 | Payer: PRIVATE HEALTH INSURANCE

## 2021-11-12 DIAGNOSIS — R634 Abnormal weight loss: Principal | ICD-10-CM

## 2021-11-12 DIAGNOSIS — E43 Unspecified severe protein-calorie malnutrition: Principal | ICD-10-CM

## 2021-11-12 DIAGNOSIS — R5383 Other fatigue: Principal | ICD-10-CM

## 2021-11-12 DIAGNOSIS — M545 Chronic midline low back pain without sciatica: Principal | ICD-10-CM

## 2021-11-12 DIAGNOSIS — R262 Difficulty in walking, not elsewhere classified: Principal | ICD-10-CM

## 2021-11-12 DIAGNOSIS — M546 Pain in thoracic spine: Principal | ICD-10-CM

## 2021-11-12 DIAGNOSIS — G35 Multiple sclerosis: Principal | ICD-10-CM

## 2021-11-12 DIAGNOSIS — G8929 Other chronic pain: Principal | ICD-10-CM

## 2021-11-12 MED ORDER — BACLOFEN 10 MG TABLET
ORAL_TABLET | ORAL | 5 refills | 30 days | Status: CP
Start: 2021-11-12 — End: ?
  Filled 2021-11-17: qty 150, 30d supply, fill #0

## 2021-11-12 MED ORDER — AMANTADINE HCL 100 MG TABLET
ORAL_TABLET | 5 refills | 0 days | Status: CP
Start: 2021-11-12 — End: ?
  Filled 2021-11-18: qty 60, 30d supply, fill #0

## 2021-11-13 DIAGNOSIS — G35 Multiple sclerosis: Principal | ICD-10-CM

## 2021-11-13 MED ORDER — LAMOTRIGINE 25 MG TABLET
ORAL_TABLET | Freq: Two times a day (BID) | ORAL | 0 refills | 30 days | Status: CP
Start: 2021-11-13 — End: 2021-12-13
  Filled 2021-11-19: qty 180, 30d supply, fill #0

## 2021-11-13 MED ORDER — DIAZEPAM 5 MG TABLET
ORAL_TABLET | Freq: Four times a day (QID) | ORAL | 0 refills | 0.00000 days | Status: CP | PRN
Start: 2021-11-13 — End: ?
  Filled 2021-11-18: qty 30, 30d supply, fill #0

## 2021-11-17 DIAGNOSIS — E559 Vitamin D deficiency, unspecified: Principal | ICD-10-CM

## 2021-11-17 MED ORDER — CHOLECALCIFEROL (VITAMIN D3) 100 MCG (4,000 UNIT) TABLET
ORAL_TABLET | Freq: Every day | ORAL | 11 refills | 30 days
Start: 2021-11-17 — End: ?

## 2021-11-19 DIAGNOSIS — E559 Vitamin D deficiency, unspecified: Principal | ICD-10-CM

## 2021-11-19 MED ORDER — CHOLECALCIFEROL (VITAMIN D3) 100 MCG (4,000 UNIT) TABLET
ORAL_TABLET | Freq: Every day | ORAL | 11 refills | 30.00000 days
Start: 2021-11-19 — End: ?

## 2021-11-20 DIAGNOSIS — Z129 Encounter for screening for malignant neoplasm, site unspecified: Principal | ICD-10-CM

## 2021-11-20 DIAGNOSIS — R634 Abnormal weight loss: Principal | ICD-10-CM

## 2021-11-20 DIAGNOSIS — M8000XS Age-related osteoporosis with current pathological fracture, unspecified site, sequela: Principal | ICD-10-CM

## 2021-11-20 DIAGNOSIS — S22070S Wedge compression fracture of T9-T10 vertebra, sequela: Principal | ICD-10-CM

## 2021-11-20 DIAGNOSIS — M818 Other osteoporosis without current pathological fracture: Principal | ICD-10-CM

## 2021-11-20 MED ORDER — VUMERITY 231 MG CAPSULE,DELAYED RELEASE
ORAL_CAPSULE | Freq: Two times a day (BID) | ORAL | 5 refills | 0 days | Status: CP
Start: 2021-11-20 — End: ?

## 2021-11-20 MED ORDER — DULOXETINE 60 MG CAPSULE,DELAYED RELEASE
ORAL_CAPSULE | Freq: Every day | ORAL | 1 refills | 90 days | Status: CP
Start: 2021-11-20 — End: ?
  Filled 2022-01-05: qty 90, 90d supply, fill #0

## 2021-11-21 DIAGNOSIS — G35 Multiple sclerosis: Principal | ICD-10-CM

## 2021-11-21 MED ORDER — CHOLECALCIFEROL (VITAMIN D3) 100 MCG (4,000 UNIT) TABLET
ORAL_TABLET | Freq: Every day | ORAL | 11 refills | 30.00000 days | Status: CP
Start: 2021-11-21 — End: ?
  Filled 2021-11-28: qty 60, 30d supply, fill #0

## 2021-11-28 MED FILL — GABAPENTIN 100 MG CAPSULE: ORAL | 30 days supply | Qty: 90 | Fill #1

## 2021-12-11 DIAGNOSIS — G35 Multiple sclerosis: Principal | ICD-10-CM

## 2021-12-11 MED ORDER — LAMOTRIGINE 25 MG TABLET
ORAL_TABLET | Freq: Two times a day (BID) | ORAL | 0 refills | 30 days | Status: CP
Start: 2021-12-11 — End: 2022-01-10
  Filled 2021-12-12: qty 180, 30d supply, fill #0

## 2021-12-15 MED FILL — MELATONIN 3 MG TABLET: ORAL | 90 days supply | Qty: 90 | Fill #1

## 2021-12-15 MED FILL — CALCIUM CARBONATE 600 MG CALCIUM (1,500 MG) TABLET: ORAL | 30 days supply | Qty: 30 | Fill #1

## 2021-12-23 DIAGNOSIS — G4700 Insomnia, unspecified: Principal | ICD-10-CM

## 2021-12-23 MED ORDER — SUVOREXANT 5 MG TABLET
ORAL_TABLET | Freq: Every evening | ORAL | 1 refills | 30 days | PRN
Start: 2021-12-23 — End: ?

## 2021-12-24 MED ORDER — SUVOREXANT 5 MG TABLET
ORAL_TABLET | Freq: Every evening | ORAL | 1 refills | 30 days | Status: CP | PRN
Start: 2021-12-24 — End: ?
  Filled 2021-12-29: qty 30, 30d supply, fill #0

## 2021-12-25 MED FILL — AMANTADINE HCL 100 MG TABLET: 30 days supply | Qty: 60 | Fill #1

## 2021-12-28 DIAGNOSIS — G35 Multiple sclerosis: Principal | ICD-10-CM

## 2021-12-28 MED ORDER — LAMOTRIGINE 25 MG TABLET
ORAL_TABLET | Freq: Two times a day (BID) | ORAL | 0 refills | 30 days
Start: 2021-12-28 — End: 2022-01-27

## 2021-12-29 MED FILL — VITAMIN D3 50 MCG (2,000 UNIT) CAPSULE: ORAL | 30 days supply | Qty: 60 | Fill #1

## 2021-12-29 MED FILL — GABAPENTIN 100 MG CAPSULE: ORAL | 30 days supply | Qty: 90 | Fill #2

## 2021-12-30 MED ORDER — LAMOTRIGINE 25 MG TABLET
ORAL_TABLET | Freq: Two times a day (BID) | ORAL | 0 refills | 30 days | Status: CP
Start: 2021-12-30 — End: 2022-01-29
  Filled 2022-01-05: qty 180, 30d supply, fill #0

## 2022-01-07 MED FILL — BACLOFEN 10 MG TABLET: ORAL | 30 days supply | Qty: 150 | Fill #1

## 2022-01-15 MED FILL — AMANTADINE HCL 100 MG TABLET: 30 days supply | Qty: 60 | Fill #2

## 2022-01-15 MED FILL — CALCIUM CARBONATE 600 MG CALCIUM (1,500 MG) TABLET: ORAL | 30 days supply | Qty: 30 | Fill #2

## 2022-01-15 MED FILL — VITAMIN D3 50 MCG (2,000 UNIT) CAPSULE: ORAL | 30 days supply | Qty: 60 | Fill #2

## 2022-01-20 MED FILL — DIAZEPAM 5 MG TABLET: ORAL | 30 days supply | Qty: 30 | Fill #1

## 2022-01-26 MED ORDER — GABAPENTIN 100 MG CAPSULE
ORAL_CAPSULE | Freq: Three times a day (TID) | ORAL | 3 refills | 30 days | Status: CP
Start: 2022-01-26 — End: 2023-01-26
  Filled 2022-01-27: qty 90, 30d supply, fill #0

## 2022-01-30 MED FILL — VITAMIN D3 50 MCG (2,000 UNIT) CAPSULE: ORAL | 30 days supply | Qty: 60 | Fill #3

## 2022-01-30 MED FILL — BELSOMRA 5 MG TABLET: ORAL | 30 days supply | Qty: 30 | Fill #1

## 2022-01-30 MED FILL — MELATONIN 3 MG TABLET: ORAL | 90 days supply | Qty: 90 | Fill #2

## 2022-01-30 MED FILL — AMANTADINE HCL 100 MG TABLET: 30 days supply | Qty: 60 | Fill #3

## 2022-02-04 MED FILL — BACLOFEN 10 MG TABLET: ORAL | 30 days supply | Qty: 150 | Fill #2

## 2022-02-06 MED ORDER — VUMERITY 231 MG CAPSULE,DELAYED RELEASE
ORAL_CAPSULE | 0 refills | 0 days
Start: 2022-02-06 — End: ?

## 2022-02-09 MED ORDER — VUMERITY 231 MG CAPSULE,DELAYED RELEASE
ORAL_CAPSULE | 0 refills | 0 days
Start: 2022-02-09 — End: ?

## 2022-02-10 DIAGNOSIS — G35 Multiple sclerosis: Principal | ICD-10-CM

## 2022-02-10 MED ORDER — VUMERITY 231 MG CAPSULE,DELAYED RELEASE
ORAL_CAPSULE | Freq: Two times a day (BID) | ORAL | 1 refills | 0 days | Status: CP
Start: 2022-02-10 — End: ?

## 2022-02-18 ENCOUNTER — Ambulatory Visit: Admit: 2022-02-18 | Discharge: 2022-02-19 | Payer: PRIVATE HEALTH INSURANCE | Attending: Neurology | Primary: Neurology

## 2022-02-18 DIAGNOSIS — N3281 Overactive bladder: Principal | ICD-10-CM

## 2022-02-18 DIAGNOSIS — Z5181 Encounter for therapeutic drug level monitoring: Principal | ICD-10-CM

## 2022-02-18 DIAGNOSIS — G35 Multiple sclerosis: Principal | ICD-10-CM

## 2022-02-18 DIAGNOSIS — H539 Unspecified visual disturbance: Principal | ICD-10-CM

## 2022-02-18 DIAGNOSIS — R231 Pallor: Principal | ICD-10-CM

## 2022-02-18 MED ORDER — FESOTERODINE ER 4 MG TABLET,EXTENDED RELEASE 24 HR
ORAL_TABLET | Freq: Every day | ORAL | 2 refills | 30 days | Status: CP
Start: 2022-02-18 — End: ?

## 2022-02-20 MED ORDER — SOLIFENACIN 5 MG TABLET
ORAL_TABLET | Freq: Every day | ORAL | 5 refills | 30 days | Status: CP
Start: 2022-02-20 — End: ?
  Filled 2022-02-20: qty 30, 30d supply, fill #0

## 2022-02-20 MED FILL — GABAPENTIN 100 MG CAPSULE: ORAL | 30 days supply | Qty: 90 | Fill #1

## 2022-02-20 MED FILL — CALCIUM CARBONATE 600 MG CALCIUM (1,500 MG) TABLET: ORAL | 30 days supply | Qty: 30 | Fill #3

## 2022-02-22 MED ORDER — AMANTADINE HCL 100 MG TABLET
ORAL_TABLET | 5 refills | 0 days | Status: CP
Start: 2022-02-22 — End: ?
  Filled 2022-03-11: qty 60, 30d supply, fill #0

## 2022-02-22 MED ORDER — SUVOREXANT 5 MG TABLET
ORAL_TABLET | Freq: Every evening | ORAL | 2 refills | 30 days | Status: CP | PRN
Start: 2022-02-22 — End: ?
  Filled 2022-03-05: qty 30, 30d supply, fill #0

## 2022-02-22 MED ORDER — LAMOTRIGINE 25 MG TABLET
ORAL_TABLET | Freq: Two times a day (BID) | ORAL | 5 refills | 30 days | Status: CP
Start: 2022-02-22 — End: ?
  Filled 2022-03-05: qty 180, 30d supply, fill #0

## 2022-02-27 MED ORDER — HYDROCORTISONE 2.5 % TOPICAL CREAM WITH PERINEAL APPLICATOR
5 refills | 0 days
Start: 2022-02-27 — End: ?

## 2022-03-04 MED ORDER — LIDOCAINE 4 % TOPICAL PATCH
MEDICATED_PATCH | TRANSDERMAL | 4 refills | 0 days
Start: 2022-03-04 — End: ?

## 2022-03-05 MED FILL — HYDROCORTISONE 2.5 % TOPICAL CREAM WITH PERINEAL APPLICATOR: 20 days supply | Qty: 30 | Fill #0

## 2022-03-05 MED FILL — BACLOFEN 10 MG TABLET: ORAL | 30 days supply | Qty: 150 | Fill #3

## 2022-03-10 MED ORDER — DIAZEPAM 5 MG TABLET
ORAL_TABLET | Freq: Every evening | ORAL | 1 refills | 30 days
Start: 2022-03-10 — End: ?

## 2022-03-11 MED ORDER — DIAZEPAM 5 MG TABLET
ORAL_TABLET | Freq: Every evening | ORAL | 1 refills | 30 days | Status: CP
Start: 2022-03-11 — End: ?
  Filled 2022-03-12: qty 30, 30d supply, fill #0

## 2022-03-11 MED FILL — DULOXETINE 60 MG CAPSULE,DELAYED RELEASE: ORAL | 90 days supply | Qty: 90 | Fill #1

## 2022-03-11 MED FILL — LIDOCAINE PAIN RELIEF 4 % TOPICAL PATCH: TRANSDERMAL | 90 days supply | Qty: 90 | Fill #0

## 2022-03-24 IMAGING — DX DG PELVIS 1-2V
1 series · 1 of 1 positions shown · non-contrast
Comparison: None.

CLINICAL DATA: Status post fall.

EXAM:
PELVIS - 1-2 VIEW

[pelvis ap]
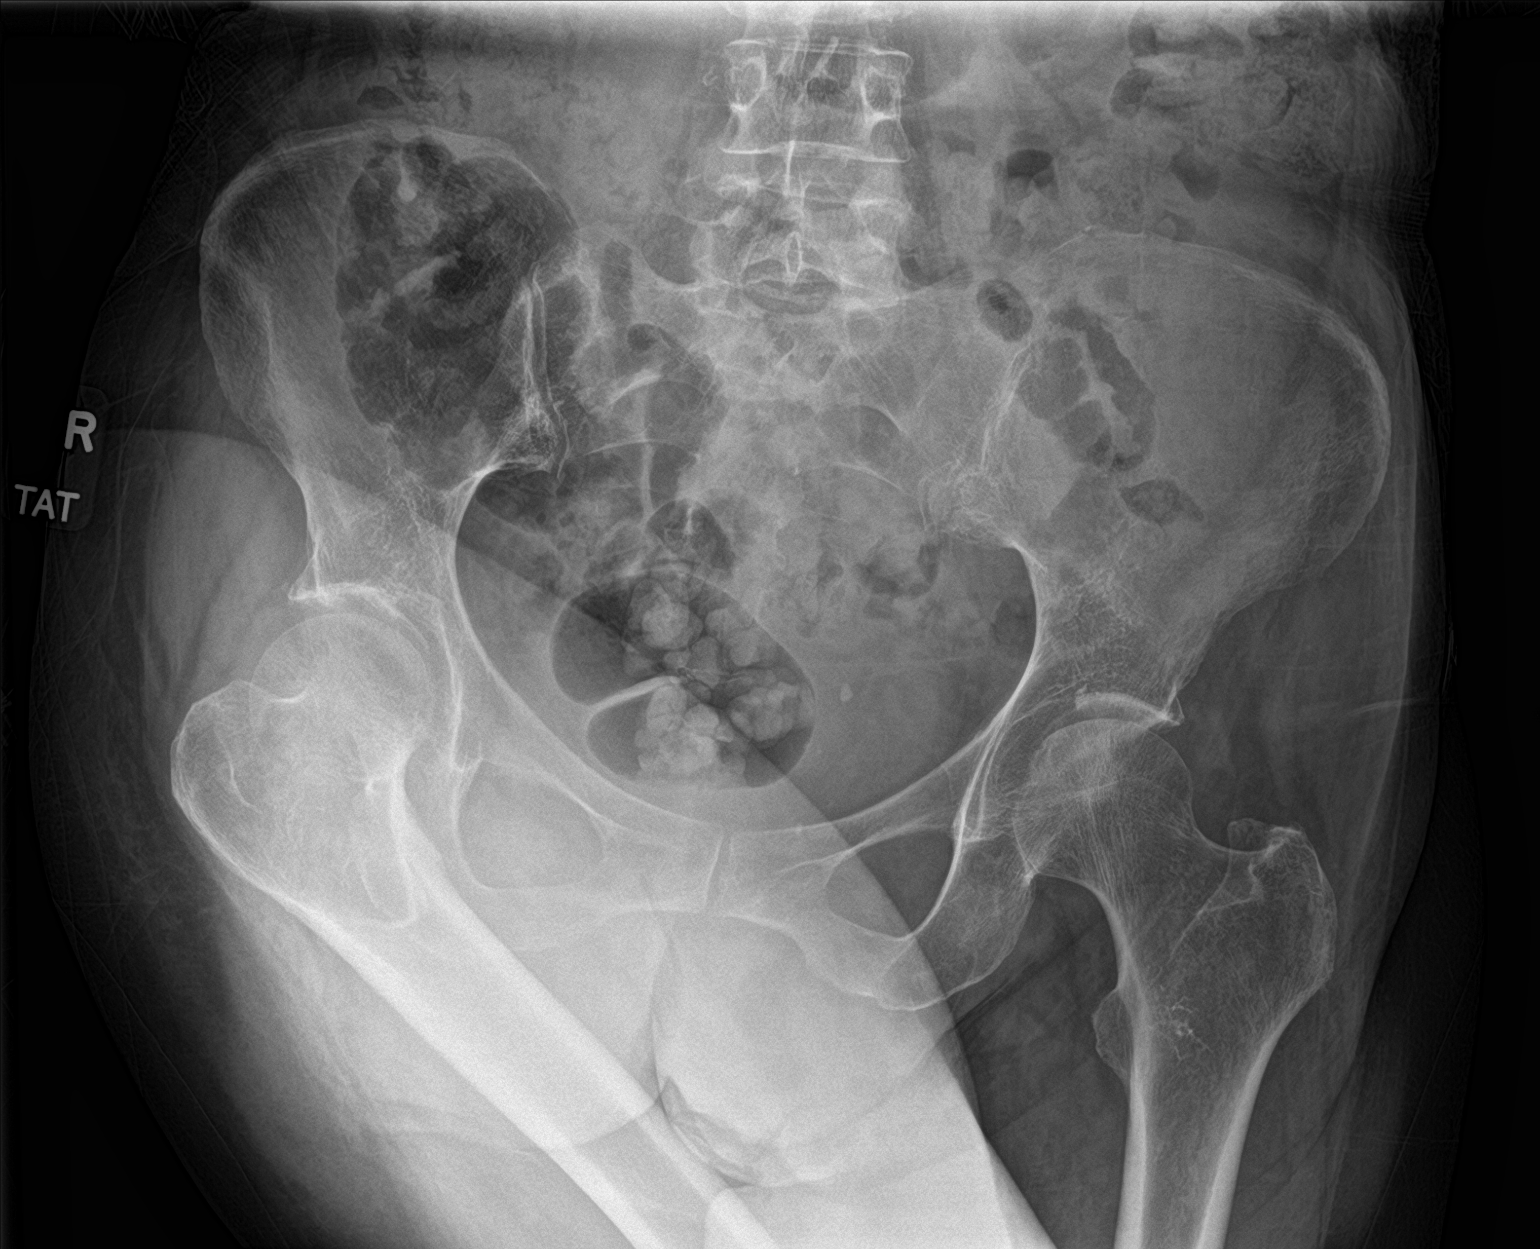

[1 of 1 positions shown; findings below may reference images not displayed]

FINDINGS: Evaluation of the right femur is limited secondary to patient
positioning. There is no evidence of pelvic fracture or diastasis.
Degenerative changes are seen involving both hips in the form of
joint space narrowing and acetabular sclerosis. No pelvic bone
lesions are seen.
IMPRESSION: 1. No acute findings.

## 2022-03-24 IMAGING — DX DG FEMUR 2+V*R*
4 series · 4 of 4 positions shown · non-contrast
Comparison: None.

CLINICAL DATA: Status post fall.

EXAM:
RIGHT FEMUR 2 VIEWS

[femur ap (1 of 2)]
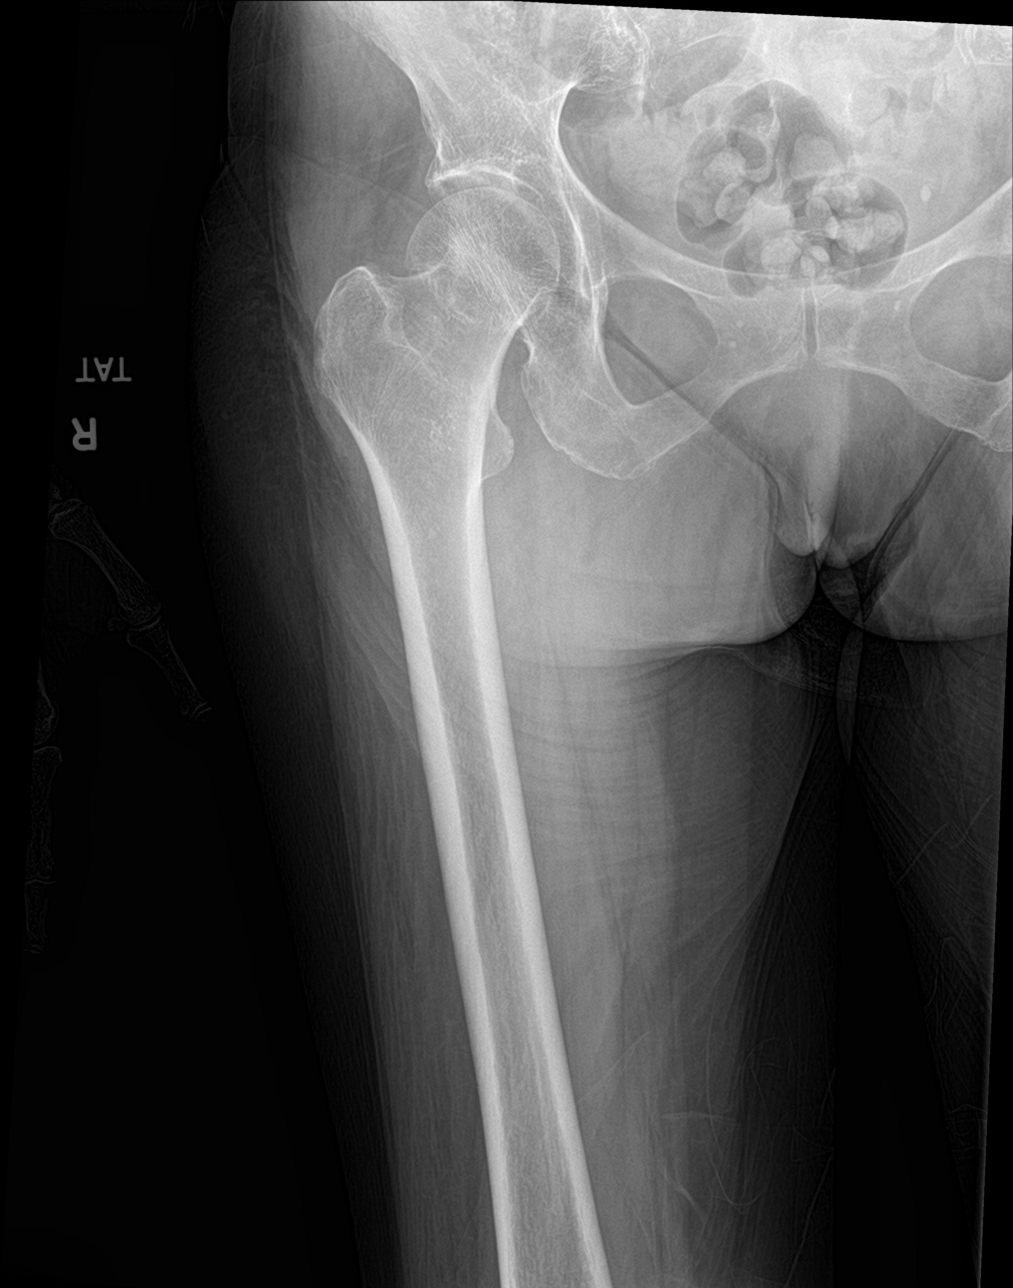

[femur ap (2 of 2)]
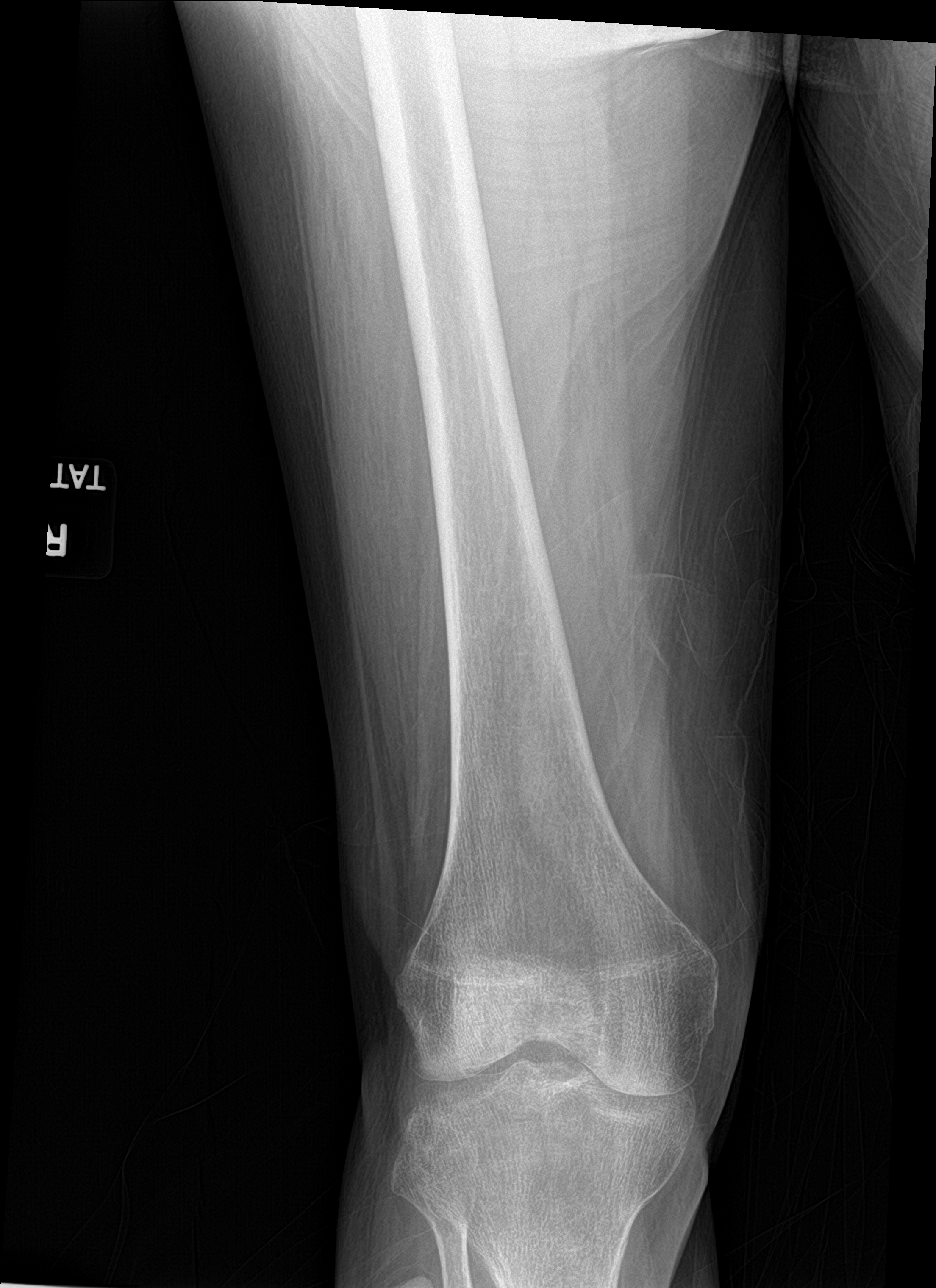

[femur lat (1 of 2)]
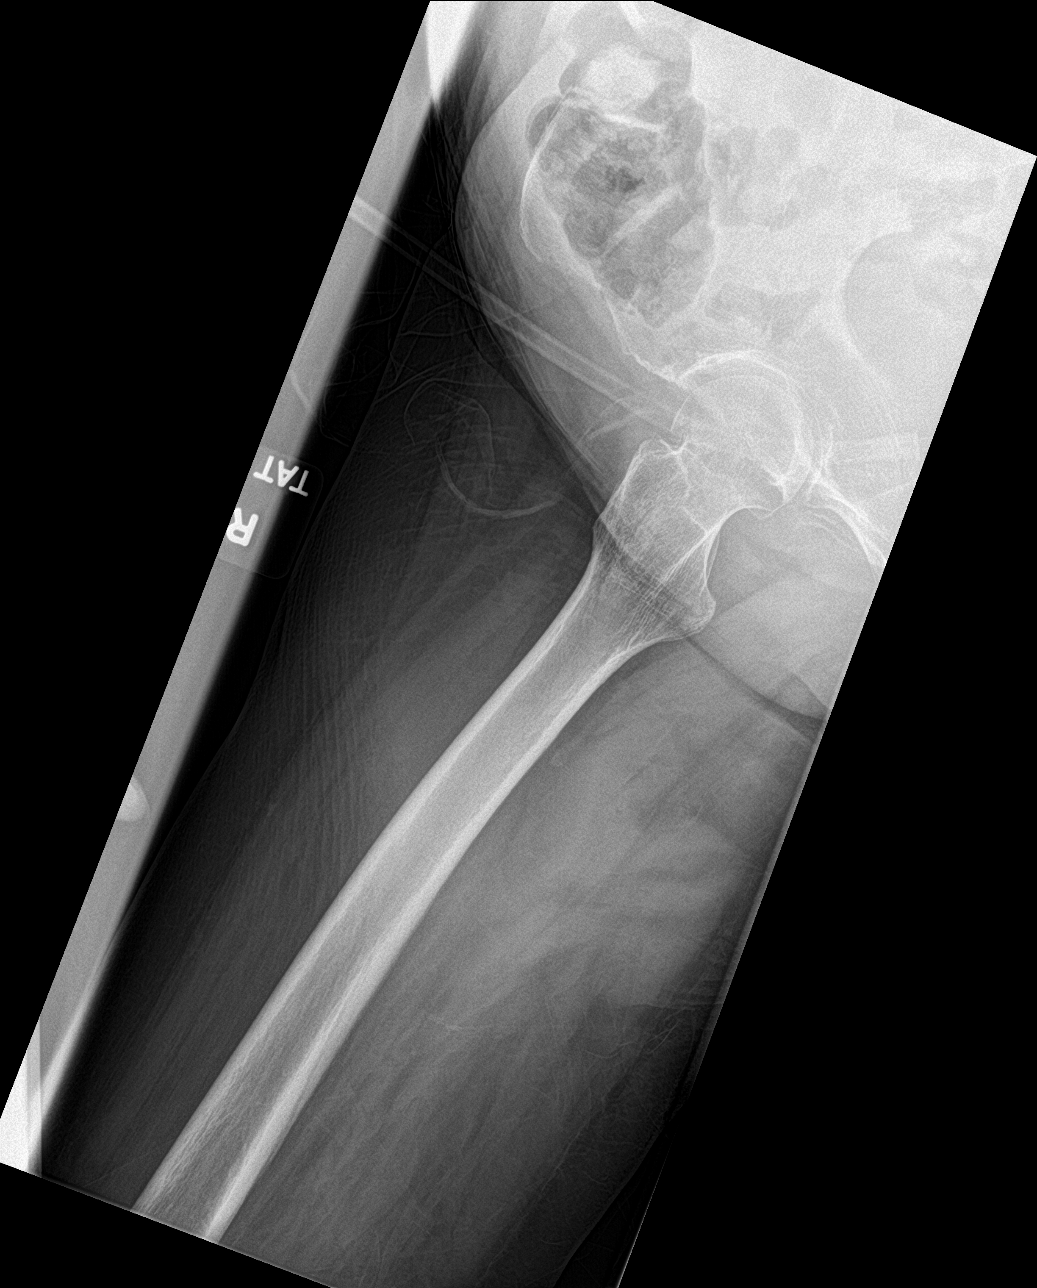

[femur lat (2 of 2)]
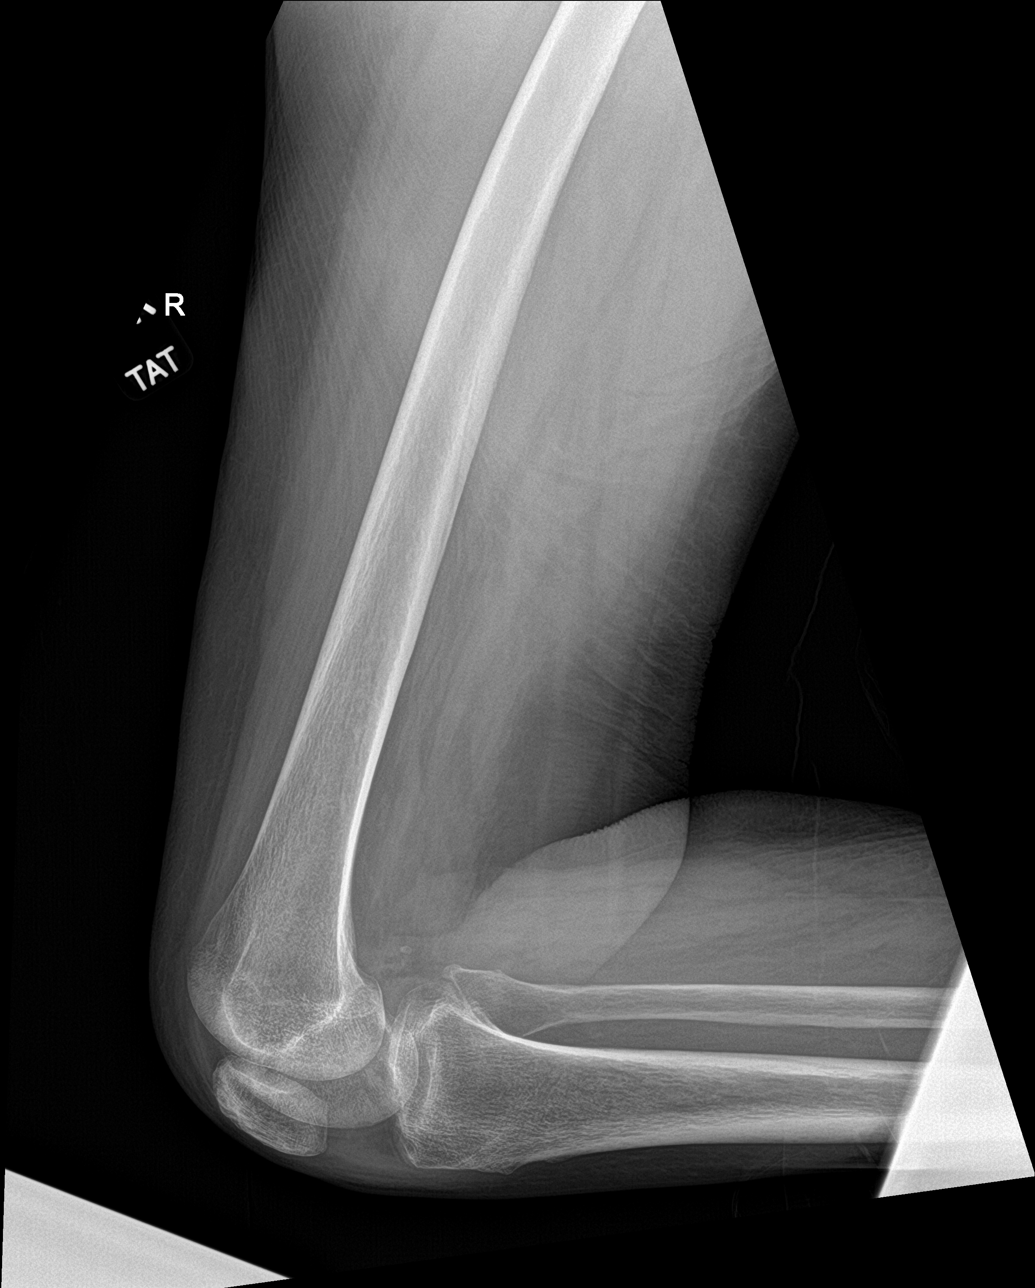

[4 of 4 positions shown; findings below may reference images not displayed]

FINDINGS: An impacted fracture deformity of indeterminate age is seen
involving the junction of the superior aspect of the right femoral
head and neck. There is no evidence of dislocation. Degenerative
changes are seen in the form of joint space narrowing and acetabular
sclerosis. Soft tissues are unremarkable.
IMPRESSION: 1. Impacted fracture deformity of the right femoral head and neck.
CT correlation is recommended.

## 2022-03-25 MED FILL — CALCIUM CARBONATE 600 MG CALCIUM (1,500 MG) TABLET: ORAL | 60 days supply | Qty: 60 | Fill #4

## 2022-03-25 MED FILL — SOLIFENACIN 5 MG TABLET: ORAL | 30 days supply | Qty: 30 | Fill #1

## 2022-03-25 MED FILL — GABAPENTIN 100 MG CAPSULE: ORAL | 30 days supply | Qty: 90 | Fill #2

## 2022-03-25 MED FILL — VITAMIN D3 50 MCG (2,000 UNIT) CAPSULE: ORAL | 30 days supply | Qty: 60 | Fill #4

## 2022-03-30 MED ORDER — ACETAMINOPHEN 500 MG TABLET
ORAL_TABLET | Freq: Three times a day (TID) | ORAL | 11 refills | 15 days | PRN
Start: 2022-03-30 — End: ?

## 2022-03-30 MED ORDER — TRAMADOL 50 MG TABLET
ORAL_TABLET | Freq: Three times a day (TID) | ORAL | 5 refills | 30 days | PRN
Start: 2022-03-30 — End: ?

## 2022-04-01 DIAGNOSIS — G35 Multiple sclerosis: Principal | ICD-10-CM

## 2022-04-01 MED ORDER — DULOXETINE 60 MG CAPSULE,DELAYED RELEASE
ORAL_CAPSULE | Freq: Every day | ORAL | 1 refills | 90 days
Start: 2022-04-01 — End: ?

## 2022-04-02 MED FILL — MELATONIN 3 MG TABLET: ORAL | 90 days supply | Qty: 90 | Fill #3

## 2022-04-02 MED FILL — ACETAMINOPHEN 500 MG TABLET: ORAL | 15 days supply | Qty: 90 | Fill #0

## 2022-04-02 MED FILL — BACLOFEN 10 MG TABLET: ORAL | 30 days supply | Qty: 150 | Fill #4

## 2022-04-02 MED FILL — LAMOTRIGINE 25 MG TABLET: ORAL | 30 days supply | Qty: 180 | Fill #1

## 2022-04-02 MED FILL — TRAMADOL 50 MG TABLET: ORAL | 30 days supply | Qty: 90 | Fill #0

## 2022-04-03 MED ORDER — DULOXETINE 60 MG CAPSULE,DELAYED RELEASE
ORAL_CAPSULE | Freq: Every day | ORAL | 1 refills | 90 days | Status: CP
Start: 2022-04-03 — End: ?

## 2022-04-14 MED FILL — DIAZEPAM 5 MG TABLET: ORAL | 30 days supply | Qty: 30 | Fill #1

## 2022-04-14 MED FILL — AMANTADINE HCL 100 MG TABLET: ORAL | 30 days supply | Qty: 60 | Fill #1

## 2022-04-22 MED FILL — BELSOMRA 5 MG TABLET: ORAL | 30 days supply | Qty: 30 | Fill #1

## 2022-04-22 MED FILL — VITAMIN D3 50 MCG (2,000 UNIT) CAPSULE: ORAL | 30 days supply | Qty: 60 | Fill #5

## 2022-04-22 MED FILL — SOLIFENACIN 5 MG TABLET: ORAL | 30 days supply | Qty: 30 | Fill #2

## 2022-04-22 MED FILL — HYDROCORTISONE 2.5 % TOPICAL CREAM WITH PERINEAL APPLICATOR: 20 days supply | Qty: 30 | Fill #1

## 2022-04-22 MED FILL — GABAPENTIN 100 MG CAPSULE: ORAL | 30 days supply | Qty: 90 | Fill #3

## 2022-04-27 MED FILL — TRAMADOL 50 MG TABLET: ORAL | 30 days supply | Qty: 90 | Fill #1

## 2022-05-02 MED ORDER — DIAZEPAM 5 MG TABLET
ORAL_TABLET | Freq: Every evening | ORAL | 1 refills | 30 days
Start: 2022-05-02 — End: ?

## 2022-05-03 MED ORDER — DIAZEPAM 5 MG TABLET
ORAL_TABLET | Freq: Every evening | ORAL | 1 refills | 30 days | Status: CP
Start: 2022-05-03 — End: ?
  Filled 2022-05-11: qty 30, 30d supply, fill #0

## 2022-05-04 ENCOUNTER — Emergency Department
Admit: 2022-05-04 | Discharge: 2022-05-04 | Disposition: A | Discharge status: Home Health | Payer: PRIVATE HEALTH INSURANCE | Attending: Student in an Organized Health Care Education/Training Program

## 2022-05-04 ENCOUNTER — Ambulatory Visit
Admit: 2022-05-04 | Discharge: 2022-05-04 | Disposition: A | Discharge status: Home Health | Payer: PRIVATE HEALTH INSURANCE | Attending: Student in an Organized Health Care Education/Training Program

## 2022-05-04 DIAGNOSIS — I82431 Acute embolism and thrombosis of right popliteal vein: Principal | ICD-10-CM

## 2022-05-04 MED ORDER — RIVAROXABAN 20 MG TABLET
ORAL_TABLET | Freq: Every day | ORAL | 1 refills | 30 days | Status: CP
Start: 2022-05-04 — End: 2022-05-04

## 2022-05-04 MED ORDER — XARELTO DVT-PE TREATMENT 30-DAY STARTER 15 MG(42)-20 MG(9) TABLET PACK
ORAL_TABLET | ORAL | 0 refills | 0.00000 days | Status: CP
Start: 2022-05-04 — End: 2022-05-04

## 2022-05-05 MED FILL — LAMOTRIGINE 25 MG TABLET: ORAL | 30 days supply | Qty: 180 | Fill #2

## 2022-05-05 MED FILL — HYDROCORTISONE 2.5 % TOPICAL CREAM WITH PERINEAL APPLICATOR: 20 days supply | Qty: 30 | Fill #2

## 2022-05-06 MED ORDER — GABAPENTIN 100 MG CAPSULE
ORAL_CAPSULE | Freq: Three times a day (TID) | ORAL | 3 refills | 30.00000 days
Start: 2022-05-06 — End: 2023-05-06

## 2022-05-06 MED FILL — TRAMADOL 50 MG TABLET: ORAL | 30 days supply | Qty: 90 | Fill #2

## 2022-05-11 MED FILL — BACLOFEN 10 MG TABLET: ORAL | 30 days supply | Qty: 150 | Fill #5

## 2022-05-12 MED ORDER — GABAPENTIN 100 MG CAPSULE
ORAL_CAPSULE | Freq: Three times a day (TID) | ORAL | 11 refills | 30 days | Status: CP
Start: 2022-05-12 — End: 2023-05-12
  Filled 2022-05-15: qty 90, 30d supply, fill #0

## 2022-05-16 DIAGNOSIS — G35 Multiple sclerosis: Principal | ICD-10-CM

## 2022-05-16 MED ORDER — BACLOFEN 10 MG TABLET
ORAL_TABLET | ORAL | 5 refills | 30 days
Start: 2022-05-16 — End: ?

## 2022-05-19 MED ORDER — BACLOFEN 10 MG TABLET
ORAL_TABLET | ORAL | 5 refills | 30 days | Status: CP
Start: 2022-05-19 — End: ?
  Filled 2022-06-03: qty 150, 30d supply, fill #0

## 2022-05-21 MED FILL — TRAMADOL 50 MG TABLET: ORAL | 30 days supply | Qty: 90 | Fill #3

## 2022-05-27 MED ORDER — XARELTO 20 MG TABLET
ORAL_TABLET | Freq: Every day | ORAL | 4 refills | 90 days
Start: 2022-05-27 — End: ?

## 2022-05-27 MED FILL — BELSOMRA 5 MG TABLET: ORAL | 30 days supply | Qty: 30 | Fill #2

## 2022-05-30 DIAGNOSIS — G959 Disease of spinal cord, unspecified: Principal | ICD-10-CM

## 2022-05-30 DIAGNOSIS — Z7952 Long term (current) use of systemic steroids: Principal | ICD-10-CM

## 2022-05-30 MED ORDER — CALCIUM CARBONATE 600 MG CALCIUM (1,500 MG) TABLET
ORAL_TABLET | Freq: Every day | ORAL | 5 refills | 30 days
Start: 2022-05-30 — End: ?

## 2022-06-01 MED ORDER — CALCIUM CARBONATE 600 MG CALCIUM (1,500 MG) TABLET
ORAL_TABLET | Freq: Every day | ORAL | 5 refills | 30 days | Status: CP
Start: 2022-06-01 — End: ?
  Filled 2022-06-03: qty 30, 30d supply, fill #0

## 2022-06-02 DIAGNOSIS — I824Y9 Acute embolism and thrombosis of unspecified deep veins of unspecified proximal lower extremity: Principal | ICD-10-CM

## 2022-06-03 MED FILL — LAMOTRIGINE 25 MG TABLET: ORAL | 30 days supply | Qty: 180 | Fill #3

## 2022-06-05 MED FILL — DIAZEPAM 5 MG TABLET: ORAL | 30 days supply | Qty: 30 | Fill #1

## 2022-06-09 MED FILL — GABAPENTIN 100 MG CAPSULE: ORAL | 30 days supply | Qty: 90 | Fill #1

## 2022-06-09 MED FILL — VITAMIN D3 50 MCG (2,000 UNIT) CAPSULE: ORAL | 30 days supply | Qty: 60 | Fill #6

## 2022-06-10 MED ORDER — DIAZEPAM 5 MG TABLET
ORAL_TABLET | Freq: Every evening | ORAL | 1 refills | 30 days | Status: CP
Start: 2022-06-10 — End: ?
  Filled 2022-07-02: qty 30, 30d supply, fill #0

## 2022-06-16 DIAGNOSIS — G4700 Insomnia, unspecified: Principal | ICD-10-CM

## 2022-06-16 DIAGNOSIS — G35 Multiple sclerosis: Principal | ICD-10-CM

## 2022-06-16 MED ORDER — MELATONIN 3 MG TABLET
ORAL_TABLET | Freq: Every evening | ORAL | 11 refills | 30 days | Status: CP
Start: 2022-06-16 — End: ?
  Filled 2022-06-19: qty 90, 90d supply, fill #0

## 2022-06-17 DIAGNOSIS — G4700 Insomnia, unspecified: Principal | ICD-10-CM

## 2022-06-17 MED ORDER — SUVOREXANT 5 MG TABLET
ORAL_TABLET | Freq: Every evening | ORAL | 2 refills | 30 days | PRN
Start: 2022-06-17 — End: ?

## 2022-06-18 MED ORDER — SUVOREXANT 5 MG TABLET
ORAL_TABLET | Freq: Every evening | ORAL | 2 refills | 30 days | PRN
Start: 2022-06-18 — End: ?

## 2022-06-19 MED FILL — TRAMADOL 50 MG TABLET: ORAL | 30 days supply | Qty: 90 | Fill #4

## 2022-06-19 MED FILL — AMANTADINE HCL 100 MG TABLET: ORAL | 30 days supply | Qty: 60 | Fill #2

## 2022-06-24 DIAGNOSIS — G4700 Insomnia, unspecified: Principal | ICD-10-CM

## 2022-06-24 MED ORDER — SUVOREXANT 5 MG TABLET
ORAL_TABLET | Freq: Every evening | ORAL | 2 refills | 30 days | PRN
Start: 2022-06-24 — End: ?

## 2022-06-27 MED ORDER — SUVOREXANT 5 MG TABLET
ORAL_TABLET | Freq: Every evening | ORAL | 2 refills | 30 days | PRN
Start: 2022-06-27 — End: ?

## 2022-07-02 MED FILL — CALCIUM CARBONATE 600 MG CALCIUM (1,500 MG) TABLET: ORAL | 30 days supply | Qty: 30 | Fill #1

## 2022-07-02 MED FILL — LAMOTRIGINE 25 MG TABLET: ORAL | 30 days supply | Qty: 180 | Fill #4

## 2022-07-02 MED FILL — SOLIFENACIN 5 MG TABLET: ORAL | 30 days supply | Qty: 30 | Fill #3

## 2022-07-06 MED FILL — DULOXETINE 60 MG CAPSULE,DELAYED RELEASE: ORAL | 90 days supply | Qty: 90 | Fill #0

## 2022-07-06 MED FILL — BACLOFEN 10 MG TABLET: ORAL | 30 days supply | Qty: 150 | Fill #1

## 2022-07-08 MED FILL — ACETAMINOPHEN 500 MG TABLET: ORAL | 15 days supply | Qty: 90 | Fill #1

## 2022-07-09 MED FILL — GABAPENTIN 100 MG CAPSULE: ORAL | 30 days supply | Qty: 90 | Fill #2

## 2022-07-16 MED FILL — TRAMADOL 50 MG TABLET: ORAL | 30 days supply | Qty: 90 | Fill #5

## 2022-07-20 MED FILL — AMANTADINE HCL 100 MG TABLET: ORAL | 30 days supply | Qty: 60 | Fill #3

## 2022-07-20 MED FILL — VITAMIN D3 50 MCG (2,000 UNIT) CAPSULE: ORAL | 30 days supply | Qty: 60 | Fill #7

## 2022-07-26 MED ORDER — TRAMADOL 50 MG TABLET
ORAL_TABLET | Freq: Three times a day (TID) | ORAL | 5 refills | 0.00000 days | PRN
Start: 2022-07-26 — End: ?

## 2022-07-31 MED FILL — BACLOFEN 10 MG TABLET: ORAL | 30 days supply | Qty: 150 | Fill #2

## 2022-07-31 MED FILL — GABAPENTIN 100 MG CAPSULE: ORAL | 30 days supply | Qty: 90 | Fill #3

## 2022-08-11 MED ORDER — HYDROCODONE 5 MG-ACETAMINOPHEN 325 MG TABLET
ORAL_TABLET | Freq: Two times a day (BID) | ORAL | 0 refills | 0.00000 days
Start: 2022-08-11 — End: ?

## 2022-08-14 MED FILL — LAMOTRIGINE 25 MG TABLET: ORAL | 30 days supply | Qty: 180 | Fill #5

## 2022-08-14 MED FILL — ACETAMINOPHEN 500 MG TABLET: ORAL | 15 days supply | Qty: 90 | Fill #2

## 2022-08-14 MED FILL — TRAMADOL 50 MG TABLET: 30 days supply | Qty: 90 | Fill #0

## 2022-08-14 MED FILL — DIAZEPAM 5 MG TABLET: ORAL | 30 days supply | Qty: 30 | Fill #1

## 2022-08-14 MED FILL — CALCIUM CARBONATE 600 MG CALCIUM (1,500 MG) TABLET: ORAL | 30 days supply | Qty: 30 | Fill #2

## 2022-08-20 MED ORDER — ELIQUIS 5 MG TABLET
ORAL_TABLET | Freq: Two times a day (BID) | ORAL | 11 refills | 30 days
Start: 2022-08-20 — End: ?

## 2022-08-20 MED ORDER — HYDROCODONE 5 MG-ACETAMINOPHEN 325 MG TABLET
ORAL_TABLET | Freq: Two times a day (BID) | ORAL | 0 refills | 30 days
Start: 2022-08-20 — End: ?

## 2022-08-24 MED FILL — AMANTADINE HCL 100 MG TABLET: ORAL | 30 days supply | Qty: 60 | Fill #4

## 2022-08-24 MED FILL — VITAMIN D3 50 MCG (2,000 UNIT) CAPSULE: ORAL | 30 days supply | Qty: 60 | Fill #8

## 2022-08-26 ENCOUNTER — Ambulatory Visit: Admit: 2022-08-26 | Discharge: 2022-08-28 | Payer: PRIVATE HEALTH INSURANCE

## 2022-08-26 ENCOUNTER — Ambulatory Visit: Admit: 2022-08-26 | Discharge: 2022-08-27 | Payer: PRIVATE HEALTH INSURANCE | Attending: Neurology | Primary: Neurology

## 2022-08-26 DIAGNOSIS — Z5181 Encounter for therapeutic drug level monitoring: Principal | ICD-10-CM

## 2022-08-26 DIAGNOSIS — N319 Neuromuscular dysfunction of bladder, unspecified: Principal | ICD-10-CM

## 2022-08-26 DIAGNOSIS — E559 Vitamin D deficiency, unspecified: Principal | ICD-10-CM

## 2022-08-26 DIAGNOSIS — G35 Multiple sclerosis: Principal | ICD-10-CM

## 2022-08-26 MED ORDER — FESOTERODINE ER 8 MG TABLET,EXTENDED RELEASE 24 HR
ORAL_TABLET | Freq: Every day | ORAL | 5 refills | 30 days | Status: SS
Start: 2022-08-26 — End: ?

## 2022-08-26 MED ORDER — LAMOTRIGINE 100 MG TABLET
ORAL_TABLET | Freq: Two times a day (BID) | ORAL | 1 refills | 90 days | Status: SS
Start: 2022-08-26 — End: ?

## 2022-08-27 MED ORDER — RIVAROXABAN 20 MG TABLET
ORAL_TABLET | Freq: Every day | ORAL | 0 refills | 90 days
Start: 2022-08-27 — End: 2022-08-27

## 2022-08-27 MED ORDER — APIXABAN 5 MG TABLET
ORAL_TABLET | Freq: Two times a day (BID) | ORAL | 0 refills | 90 days
Start: 2022-08-27 — End: 2022-08-27

## 2022-08-28 MED ORDER — BACLOFEN 10 MG TABLET
ORAL_TABLET | 5 refills | 0 days | Status: CP
Start: 2022-08-28 — End: ?
  Filled 2022-08-28: qty 150, 25d supply, fill #0

## 2022-08-28 MED ORDER — APIXABAN 5 MG TABLET
ORAL_TABLET | Freq: Two times a day (BID) | ORAL | 0 refills | 90 days | Status: CP
Start: 2022-08-28 — End: ?
  Filled 2022-08-28: qty 60, 30d supply, fill #0

## 2022-08-29 MED ORDER — CYANOCOBALAMIN (VIT B-12) 1,000 MCG TABLET
Freq: Every day | ORAL | 0 refills | 0 days
Start: 2022-08-29 — End: ?

## 2022-08-30 MED ORDER — DIAZEPAM 5 MG TABLET
ORAL_TABLET | Freq: Every evening | ORAL | 1 refills | 30 days | Status: CP
Start: 2022-08-30 — End: ?
  Filled 2022-09-09: qty 30, 30d supply, fill #0

## 2022-08-31 MED ORDER — CYANOCOBALAMIN (VIT B-12) 1,000 MCG TABLET
ORAL_TABLET | Freq: Every day | ORAL | 11 refills | 30 days | Status: CP
Start: 2022-08-31 — End: ?

## 2022-09-02 MED ORDER — CYANOCOBALAMIN (VIT B-12) 1,000 MCG TABLET
ORAL_TABLET | Freq: Every day | ORAL | 11 refills | 30 days | Status: CP
Start: 2022-09-02 — End: ?
  Filled 2022-09-04: qty 130, 130d supply, fill #0

## 2022-09-04 MED FILL — LAMOTRIGINE 100 MG TABLET: ORAL | 90 days supply | Qty: 180 | Fill #0

## 2022-09-04 MED FILL — FESOTERODINE ER 8 MG TABLET,EXTENDED RELEASE 24 HR: ORAL | 30 days supply | Qty: 30 | Fill #0

## 2022-09-11 MED ORDER — GABAPENTIN 100 MG CAPSULE
ORAL_CAPSULE | Freq: Three times a day (TID) | ORAL | 11 refills | 30 days | Status: CP
Start: 2022-09-11 — End: 2023-09-11
  Filled 2022-09-14: qty 90, 30d supply, fill #0

## 2022-09-14 MED FILL — MELATONIN 3 MG TABLET: ORAL | 90 days supply | Qty: 90 | Fill #1

## 2022-09-14 MED FILL — CALCIUM CARBONATE 600 MG CALCIUM (1,500 MG) TABLET: ORAL | 30 days supply | Qty: 30 | Fill #3

## 2022-09-16 MED FILL — BACLOFEN 10 MG TABLET: 25 days supply | Qty: 150 | Fill #0

## 2022-09-25 MED FILL — AMANTADINE HCL 100 MG TABLET: ORAL | 30 days supply | Qty: 60 | Fill #5

## 2022-09-30 MED ORDER — GABAPENTIN 300 MG CAPSULE
ORAL_CAPSULE | Freq: Three times a day (TID) | ORAL | 0 refills | 90 days
Start: 2022-09-30 — End: ?

## 2022-10-01 MED ORDER — GABAPENTIN 300 MG CAPSULE
ORAL_CAPSULE | Freq: Three times a day (TID) | ORAL | 0 refills | 90 days
Start: 2022-10-01 — End: ?

## 2022-10-05 MED FILL — FESOTERODINE ER 8 MG TABLET,EXTENDED RELEASE 24 HR: ORAL | 30 days supply | Qty: 30 | Fill #1

## 2022-10-05 MED FILL — DULOXETINE 60 MG CAPSULE,DELAYED RELEASE: ORAL | 90 days supply | Qty: 90 | Fill #1

## 2022-10-05 MED FILL — VITAMIN D3 50 MCG (2,000 UNIT) CAPSULE: ORAL | 30 days supply | Qty: 60 | Fill #9

## 2022-10-06 MED FILL — DIAZEPAM 5 MG TABLET: ORAL | 30 days supply | Qty: 30 | Fill #1

## 2022-10-12 MED FILL — BACLOFEN 10 MG TABLET: 25 days supply | Qty: 150 | Fill #1

## 2022-10-15 MED ORDER — GABAPENTIN 300 MG CAPSULE
ORAL_CAPSULE | Freq: Three times a day (TID) | ORAL | 0 refills | 90 days
Start: 2022-10-15 — End: ?

## 2022-10-16 MED ORDER — HYDROCODONE 5 MG-ACETAMINOPHEN 325 MG TABLET
ORAL_TABLET | ORAL | 0 refills | 2 days | PRN
Start: 2022-10-16 — End: ?

## 2022-10-16 MED FILL — VITAMIN D3 50 MCG (2,000 UNIT) CAPSULE: ORAL | 30 days supply | Qty: 60 | Fill #10

## 2022-10-17 MED ORDER — GABAPENTIN 300 MG CAPSULE
ORAL_CAPSULE | Freq: Three times a day (TID) | ORAL | 0 refills | 90 days | Status: CP
Start: 2022-10-17 — End: ?
  Filled 2022-10-22: qty 270, 90d supply, fill #0

## 2022-10-18 DIAGNOSIS — R5383 Other fatigue: Principal | ICD-10-CM

## 2022-10-18 DIAGNOSIS — G35 Multiple sclerosis: Principal | ICD-10-CM

## 2022-10-18 MED ORDER — AMANTADINE HCL 100 MG TABLET
ORAL_TABLET | Freq: Every morning | ORAL | 5 refills | 30 days
Start: 2022-10-18 — End: ?

## 2022-10-18 MED ORDER — DIAZEPAM 5 MG TABLET
ORAL_TABLET | Freq: Every evening | ORAL | 1 refills | 30 days | Status: CP
Start: 2022-10-18 — End: ?
  Filled 2022-10-30: qty 30, 30d supply, fill #0

## 2022-10-20 MED ORDER — HYDROCODONE 5 MG-ACETAMINOPHEN 325 MG TABLET
ORAL_TABLET | ORAL | 0 refills | 2 days | PRN
Start: 2022-10-20 — End: ?

## 2022-10-20 MED ORDER — AMANTADINE HCL 100 MG TABLET
ORAL_TABLET | Freq: Every morning | ORAL | 5 refills | 30 days | Status: CP
Start: 2022-10-20 — End: ?
  Filled 2022-10-22: qty 60, 30d supply, fill #0

## 2022-10-21 MED FILL — CALCIUM 600 MG (AS CALCIUM CARBONATE 1,500 MG) TABLET: ORAL | 30 days supply | Qty: 30 | Fill #4

## 2022-11-04 MED FILL — FESOTERODINE ER 8 MG TABLET,EXTENDED RELEASE 24 HR: ORAL | 30 days supply | Qty: 30 | Fill #2

## 2022-11-06 MED FILL — BACLOFEN 10 MG TABLET: 25 days supply | Qty: 150 | Fill #2

## 2022-11-20 MED FILL — CHOLECALCIFEROL (VITAMIN D3) 50 MCG (2,000 UNIT) CAPSULE: ORAL | 50 days supply | Qty: 100 | Fill #11

## 2022-11-20 MED FILL — CALCIUM 600 MG (AS CALCIUM CARBONATE 1,500 MG) TABLET: ORAL | 30 days supply | Qty: 30 | Fill #5

## 2022-11-20 MED FILL — ACETAMINOPHEN 500 MG TABLET: ORAL | 15 days supply | Qty: 90 | Fill #3

## 2022-11-20 MED FILL — LAMOTRIGINE 100 MG TABLET: ORAL | 90 days supply | Qty: 180 | Fill #1

## 2022-12-04 DIAGNOSIS — Z7952 Long term (current) use of systemic steroids: Principal | ICD-10-CM

## 2022-12-04 DIAGNOSIS — G959 Disease of spinal cord, unspecified: Principal | ICD-10-CM

## 2022-12-04 MED ORDER — CALCIUM 600 MG (AS CALCIUM CARBONATE 1,500 MG) TABLET
ORAL_TABLET | Freq: Every day | ORAL | 5 refills | 30 days | Status: CP
Start: 2022-12-04 — End: ?
  Filled 2022-12-07: qty 30, 30d supply, fill #0

## 2022-12-04 MED FILL — AMANTADINE HCL 100 MG TABLET: ORAL | 30 days supply | Qty: 60 | Fill #1

## 2022-12-09 MED FILL — DIAZEPAM 5 MG TABLET: ORAL | 30 days supply | Qty: 30 | Fill #1

## 2022-12-19 ENCOUNTER — Emergency Department
Admit: 2022-12-19 | Discharge: 2022-12-20 | Disposition: A | Discharge status: Nursing Home (Includes ICF) | Payer: PRIVATE HEALTH INSURANCE

## 2022-12-19 ENCOUNTER — Ambulatory Visit
Admit: 2022-12-19 | Discharge: 2022-12-20 | Disposition: A | Discharge status: Nursing Home (Includes ICF) | Payer: PRIVATE HEALTH INSURANCE

## 2022-12-19 MED ORDER — POLYETHYLENE GLYCOL 3350 17 GRAM ORAL POWDER PACKET
PACK | Freq: Every day | ORAL | 0 refills | 30 days | Status: CP
Start: 2022-12-19 — End: 2023-01-18

## 2022-12-19 MED ORDER — DOCUSATE SODIUM 100 MG CAPSULE
ORAL_CAPSULE | Freq: Two times a day (BID) | ORAL | 0 refills | 30 days | Status: CP
Start: 2022-12-19 — End: 2023-01-18
  Filled 2023-01-15: qty 60, 30d supply, fill #0

## 2022-12-21 MED FILL — BACLOFEN 10 MG TABLET: 50 days supply | Qty: 300 | Fill #3

## 2023-01-12 DIAGNOSIS — G35 Multiple sclerosis: Principal | ICD-10-CM

## 2023-01-12 MED ORDER — DIAZEPAM 5 MG TABLET
ORAL_TABLET | Freq: Every evening | ORAL | 1 refills | 30 days | Status: CP
Start: 2023-01-12 — End: ?
  Filled 2023-01-15: qty 30, 30d supply, fill #0

## 2023-01-12 MED ORDER — GABAPENTIN 300 MG CAPSULE
ORAL_CAPSULE | Freq: Three times a day (TID) | ORAL | 0 refills | 90 days | Status: CP
Start: 2023-01-12 — End: ?
  Filled 2023-01-15: qty 270, 90d supply, fill #0

## 2023-01-12 MED ORDER — DULOXETINE 60 MG CAPSULE,DELAYED RELEASE
ORAL_CAPSULE | Freq: Every day | ORAL | 1 refills | 90 days | Status: CP
Start: 2023-01-12 — End: ?
  Filled 2023-01-15: qty 90, 90d supply, fill #0

## 2023-01-15 MED FILL — AMANTADINE HCL 100 MG TABLET: ORAL | 30 days supply | Qty: 60 | Fill #2

## 2023-01-15 MED FILL — ELIQUIS 5 MG TABLET: ORAL | 30 days supply | Qty: 60 | Fill #0

## 2023-01-31 MED ORDER — GABAPENTIN 300 MG CAPSULE
ORAL_CAPSULE | Freq: Three times a day (TID) | ORAL | 0 refills | 90 days
Start: 2023-01-31 — End: ?

## 2023-01-31 MED ORDER — SUVOREXANT 5 MG TABLET
Freq: Every evening | ORAL | 0 refills | 0 days | PRN
Start: 2023-01-31 — End: ?

## 2023-02-02 MED ORDER — GABAPENTIN 300 MG CAPSULE
ORAL_CAPSULE | Freq: Three times a day (TID) | ORAL | 0 refills | 90 days | Status: CP
Start: 2023-02-02 — End: ?

## 2023-02-02 MED ORDER — SUVOREXANT 5 MG TABLET
Freq: Every evening | ORAL | 0 refills | 0 days | PRN
Start: 2023-02-02 — End: ?

## 2023-02-03 MED FILL — MELATONIN 3 MG TABLET: ORAL | 90 days supply | Qty: 90 | Fill #2

## 2023-02-03 MED FILL — FESOTERODINE ER 8 MG TABLET,EXTENDED RELEASE 24 HR: ORAL | 30 days supply | Qty: 30 | Fill #3

## 2023-02-03 MED FILL — CALCIUM 600 MG (AS CALCIUM CARBONATE 1,500 MG) TABLET: ORAL | 30 days supply | Qty: 30 | Fill #1

## 2023-02-03 MED FILL — HYDROCORTISONE 2.5 % TOPICAL CREAM WITH PERINEAL APPLICATOR: 20 days supply | Qty: 30 | Fill #3

## 2023-02-03 MED FILL — CYANOCOBALAMIN (VIT B-12) 1,000 MCG TABLET: ORAL | 130 days supply | Qty: 130 | Fill #1

## 2023-02-11 MED FILL — DIAZEPAM 5 MG TABLET: ORAL | 30 days supply | Qty: 30 | Fill #1

## 2023-02-11 MED FILL — ELIQUIS 5 MG TABLET: ORAL | 30 days supply | Qty: 60 | Fill #1

## 2023-02-13 DIAGNOSIS — G35 Multiple sclerosis: Principal | ICD-10-CM

## 2023-02-13 MED ORDER — VUMERITY 231 MG CAPSULE,DELAYED RELEASE
ORAL_CAPSULE | 0 refills | 0 days
Start: 2023-02-13 — End: ?

## 2023-02-18 MED ORDER — VUMERITY 231 MG CAPSULE,DELAYED RELEASE
ORAL_CAPSULE | 0 refills | 0 days | Status: CP
Start: 2023-02-18 — End: ?

## 2023-03-04 DIAGNOSIS — E559 Vitamin D deficiency, unspecified: Principal | ICD-10-CM

## 2023-03-04 DIAGNOSIS — G35 Multiple sclerosis: Principal | ICD-10-CM

## 2023-03-04 MED ORDER — LAMOTRIGINE 100 MG TABLET
ORAL_TABLET | Freq: Two times a day (BID) | ORAL | 1 refills | 90 days
Start: 2023-03-04 — End: ?

## 2023-03-04 MED ORDER — CHOLECALCIFEROL (VITAMIN D3) 50 MCG (2,000 UNIT) CAPSULE
ORAL_CAPSULE | Freq: Every day | ORAL | 11 refills | 30 days
Start: 2023-03-04 — End: ?

## 2023-03-05 MED ORDER — CHOLECALCIFEROL (VITAMIN D3) 50 MCG (2,000 UNIT) CAPSULE
ORAL_CAPSULE | Freq: Every day | ORAL | 11 refills | 30 days
Start: 2023-03-05 — End: ?

## 2023-03-05 MED ORDER — LAMOTRIGINE 100 MG TABLET
ORAL_TABLET | Freq: Two times a day (BID) | ORAL | 1 refills | 90 days
Start: 2023-03-05 — End: ?

## 2023-03-07 DIAGNOSIS — E559 Vitamin D deficiency, unspecified: Principal | ICD-10-CM

## 2023-03-07 DIAGNOSIS — G35 Multiple sclerosis: Principal | ICD-10-CM

## 2023-03-07 MED ORDER — CHOLECALCIFEROL (VITAMIN D3) 50 MCG (2,000 UNIT) CAPSULE
ORAL_CAPSULE | Freq: Every day | ORAL | 11 refills | 30 days | Status: CP
Start: 2023-03-07 — End: ?
  Filled 2023-03-09: qty 100, 50d supply, fill #0

## 2023-03-07 MED ORDER — LAMOTRIGINE 100 MG TABLET
ORAL_TABLET | Freq: Two times a day (BID) | ORAL | 1 refills | 90 days | Status: CP
Start: 2023-03-07 — End: ?
  Filled 2023-03-09: qty 180, 90d supply, fill #0

## 2023-03-08 MED ORDER — BACLOFEN 10 MG TABLET
ORAL_TABLET | 0 refills | 0 days | Status: CP
Start: 2023-03-08 — End: ?

## 2023-03-08 MED ORDER — DIAZEPAM 5 MG TABLET
ORAL_TABLET | Freq: Every evening | ORAL | 0 refills | 30 days | Status: CP
Start: 2023-03-08 — End: ?

## 2023-03-08 MED ORDER — GABAPENTIN 300 MG CAPSULE
ORAL_CAPSULE | Freq: Three times a day (TID) | ORAL | 0 refills | 90 days | Status: CP
Start: 2023-03-08 — End: ?

## 2023-03-08 MED ORDER — APIXABAN 5 MG TABLET
ORAL_TABLET | Freq: Two times a day (BID) | ORAL | 0 refills | 90 days | Status: CP
Start: 2023-03-08 — End: ?

## 2023-03-09 MED FILL — CALCIUM 600 MG (AS CALCIUM CARBONATE 1,500 MG) TABLET: ORAL | 60 days supply | Qty: 60 | Fill #2

## 2023-03-24 MED ORDER — DIAZEPAM 5 MG TABLET
ORAL_TABLET | Freq: Every evening | ORAL | 0 refills | 30 days
Start: 2023-03-24 — End: ?

## 2023-03-25 MED ORDER — DIAZEPAM 5 MG TABLET
ORAL_TABLET | Freq: Every evening | ORAL | 0 refills | 30 days
Start: 2023-03-25 — End: ?

## 2023-03-29 MED FILL — ACETAMINOPHEN 500 MG TABLET: ORAL | 15 days supply | Qty: 90 | Fill #4

## 2023-03-31 ENCOUNTER — Telehealth
Admit: 2023-03-31 | Discharge: 2023-04-01 | Attending: Student in an Organized Health Care Education/Training Program | Primary: Student in an Organized Health Care Education/Training Program

## 2023-03-31 DIAGNOSIS — G8220 Paraplegia, unspecified: Principal | ICD-10-CM

## 2023-03-31 DIAGNOSIS — G35 Multiple sclerosis: Principal | ICD-10-CM

## 2023-03-31 MED ORDER — GABAPENTIN 600 MG TABLET
ORAL_TABLET | Freq: Every evening | ORAL | 3 refills | 90 days
Start: 2023-03-31 — End: 2024-03-30

## 2023-03-31 MED ORDER — DIAZEPAM 5 MG TABLET
ORAL_TABLET | Freq: Two times a day (BID) | ORAL | 1 refills | 30 days
Start: 2023-03-31 — End: 2023-05-30

## 2023-03-31 MED ORDER — GABAPENTIN 300 MG CAPSULE
ORAL_CAPSULE | Freq: Two times a day (BID) | ORAL | 1 refills | 30 days | Status: CP
Start: 2023-03-31 — End: 2023-05-30

## 2023-03-31 MED ORDER — BACLOFEN 20 MG TABLET
ORAL_TABLET | Freq: Every evening | ORAL | 1 refills | 15 days | Status: CN
Start: 2023-03-31 — End: 2023-04-30

## 2023-04-02 DIAGNOSIS — G35 Multiple sclerosis: Principal | ICD-10-CM

## 2023-04-02 MED ORDER — GABAPENTIN 600 MG TABLET
ORAL_TABLET | Freq: Every evening | ORAL | 3 refills | 90 days | Status: CP
Start: 2023-04-02 — End: 2024-04-01

## 2023-04-02 MED ORDER — DIAZEPAM 5 MG TABLET
ORAL_TABLET | Freq: Two times a day (BID) | ORAL | 0 refills | 30.00000 days | Status: CP
Start: 2023-04-02 — End: 2023-05-02

## 2023-04-02 MED ORDER — BACLOFEN 20 MG TABLET
ORAL_TABLET | Freq: Two times a day (BID) | ORAL | 1 refills | 30.00000 days | Status: CP
Start: 2023-04-02 — End: 2023-06-01

## 2023-04-03 MED ORDER — DIAZEPAM 5 MG TABLET
ORAL_TABLET | Freq: Two times a day (BID) | ORAL | 2 refills | 30 days | Status: CP
Start: 2023-04-03 — End: ?

## 2023-04-05 MED ORDER — DIAZEPAM 5 MG TABLET
ORAL_TABLET | Freq: Two times a day (BID) | ORAL | 2 refills | 30 days
Start: 2023-04-05 — End: ?

## 2023-04-15 DIAGNOSIS — G4700 Insomnia, unspecified: Principal | ICD-10-CM

## 2023-04-15 DIAGNOSIS — G35 Multiple sclerosis: Principal | ICD-10-CM

## 2023-04-15 MED ORDER — MELATONIN 3 MG TABLET
ORAL_TABLET | Freq: Every evening | ORAL | 11 refills | 30 days | Status: CP
Start: 2023-04-15 — End: ?
  Filled 2023-04-14: qty 100, 100d supply, fill #3
  Filled 2023-04-21: qty 100, 100d supply, fill #0

## 2023-04-15 MED ORDER — HYDROCODONE 5 MG-ACETAMINOPHEN 325 MG TABLET
ORAL_TABLET | Freq: Two times a day (BID) | ORAL | 0 refills | 30.00000 days
Start: 2023-04-15 — End: ?

## 2023-04-15 MED ORDER — BACLOFEN 20 MG TABLET
ORAL_TABLET | Freq: Two times a day (BID) | ORAL | 1 refills | 30 days | Status: CP
Start: 2023-04-15 — End: 2023-06-14
  Filled 2023-05-24: qty 60, 30d supply, fill #0

## 2023-04-15 MED ORDER — ACETAMINOPHEN 500 MG TABLET
ORAL_TABLET | Freq: Three times a day (TID) | ORAL | 11 refills | 15 days | PRN
Start: 2023-04-15 — End: ?

## 2023-04-21 MED FILL — DULOXETINE 60 MG CAPSULE,DELAYED RELEASE: ORAL | 90 days supply | Qty: 90 | Fill #1

## 2023-04-21 MED FILL — ACETAMINOPHEN 500 MG TABLET: ORAL | 15 days supply | Qty: 90 | Fill #0

## 2023-04-29 MED ORDER — DIAZEPAM 5 MG TABLET
ORAL_TABLET | Freq: Two times a day (BID) | ORAL | 0 refills | 30 days | Status: CP
Start: 2023-04-29 — End: ?

## 2023-05-09 DIAGNOSIS — G35 Multiple sclerosis: Principal | ICD-10-CM

## 2023-05-09 MED ORDER — HYDROCODONE 5 MG-ACETAMINOPHEN 325 MG TABLET
ORAL_TABLET | Freq: Two times a day (BID) | ORAL | 0 refills | 30 days
Start: 2023-05-09 — End: ?

## 2023-05-09 MED ORDER — APIXABAN 5 MG TABLET
ORAL_TABLET | Freq: Two times a day (BID) | ORAL | 0 refills | 90 days
Start: 2023-05-09 — End: ?

## 2023-05-09 MED ORDER — DULOXETINE 60 MG CAPSULE,DELAYED RELEASE
ORAL_CAPSULE | Freq: Every day | ORAL | 1 refills | 90 days
Start: 2023-05-09 — End: ?

## 2023-05-10 MED ORDER — DULOXETINE 60 MG CAPSULE,DELAYED RELEASE
ORAL_CAPSULE | Freq: Every day | ORAL | 1 refills | 90 days | Status: CP
Start: 2023-05-10 — End: ?
  Filled 2023-07-28: qty 90, 90d supply, fill #0

## 2023-05-10 MED ORDER — APIXABAN 5 MG TABLET
ORAL_TABLET | Freq: Two times a day (BID) | ORAL | 0 refills | 90 days | Status: CP
Start: 2023-05-10 — End: ?
  Filled 2023-05-19: qty 180, 90d supply, fill #0

## 2023-05-11 MED ORDER — GABAPENTIN 600 MG TABLET
ORAL_TABLET | Freq: Every evening | ORAL | 3 refills | 90 days | Status: CP
Start: 2023-05-11 — End: 2024-05-10
  Filled 2023-06-21: qty 90, 90d supply, fill #0

## 2023-05-17 ENCOUNTER — Telehealth
Admit: 2023-05-17 | Discharge: 2023-05-18 | Payer: PRIVATE HEALTH INSURANCE | Attending: Mental Health | Primary: Mental Health

## 2023-05-17 DIAGNOSIS — I82492 Acute embolism and thrombosis of other specified deep vein of left lower extremity: Principal | ICD-10-CM

## 2023-05-17 DIAGNOSIS — N319 Neuromuscular dysfunction of bladder, unspecified: Principal | ICD-10-CM

## 2023-05-17 DIAGNOSIS — G35 Multiple sclerosis: Principal | ICD-10-CM

## 2023-05-17 DIAGNOSIS — G8220 Paraplegia, unspecified: Principal | ICD-10-CM

## 2023-05-17 MED ORDER — GABAPENTIN 300 MG CAPSULE
ORAL_CAPSULE | Freq: Two times a day (BID) | ORAL | 1 refills | 30 days
Start: 2023-05-17 — End: 2023-07-16

## 2023-05-17 MED ORDER — DIAZEPAM 5 MG TABLET
ORAL_TABLET | Freq: Two times a day (BID) | ORAL | 0 refills | 30 days
Start: 2023-05-17 — End: ?

## 2023-05-18 MED ORDER — GABAPENTIN 300 MG CAPSULE
ORAL_CAPSULE | Freq: Two times a day (BID) | ORAL | 1 refills | 30 days
Start: 2023-05-18 — End: 2023-07-17

## 2023-05-18 MED ORDER — DIAZEPAM 5 MG TABLET
ORAL_TABLET | Freq: Two times a day (BID) | ORAL | 0 refills | 30 days
Start: 2023-05-18 — End: ?

## 2023-05-19 MED FILL — CHOLECALCIFEROL (VITAMIN D3) 50 MCG (2,000 UNIT) CAPSULE: ORAL | 50 days supply | Qty: 100 | Fill #1

## 2023-05-19 MED FILL — CALCIUM 600 MG (AS CALCIUM CARBONATE 1,500 MG) TABLET: ORAL | 60 days supply | Qty: 60 | Fill #3

## 2023-05-19 MED FILL — ACETAMINOPHEN 500 MG TABLET: ORAL | 15 days supply | Qty: 90 | Fill #1

## 2023-05-19 MED FILL — LAMOTRIGINE 100 MG TABLET: ORAL | 90 days supply | Qty: 180 | Fill #1

## 2023-06-03 DIAGNOSIS — G35 Multiple sclerosis: Principal | ICD-10-CM

## 2023-06-03 MED ORDER — HYDROCORTISONE 2.5 % TOPICAL CREAM WITH PERINEAL APPLICATOR
5 refills | 0.00 days
Start: 2023-06-03 — End: ?

## 2023-06-03 MED ORDER — LAMOTRIGINE 100 MG TABLET
ORAL_TABLET | Freq: Two times a day (BID) | ORAL | 1 refills | 90.00 days
Start: 2023-06-03 — End: ?

## 2023-06-03 MED ORDER — LIDOCAINE 4 % TOPICAL PATCH
MEDICATED_PATCH | TRANSDERMAL | 4 refills | 0.00 days
Start: 2023-06-03 — End: ?

## 2023-06-03 MED ORDER — HYDROCODONE 5 MG-ACETAMINOPHEN 325 MG TABLET
ORAL_TABLET | Freq: Two times a day (BID) | ORAL | 0 refills | 30.00 days
Start: 2023-06-03 — End: ?

## 2023-06-05 MED ORDER — LAMOTRIGINE 100 MG TABLET
ORAL_TABLET | Freq: Two times a day (BID) | ORAL | 1 refills | 90.00 days | Status: CP
Start: 2023-06-05 — End: ?

## 2023-06-16 MED ORDER — HYDROCODONE 5 MG-ACETAMINOPHEN 325 MG TABLET
ORAL_TABLET | Freq: Two times a day (BID) | ORAL | 0 refills | 30.00 days
Start: 2023-06-16 — End: ?

## 2023-06-16 MED ORDER — APIXABAN 5 MG TABLET
ORAL_TABLET | Freq: Two times a day (BID) | ORAL | 0 refills | 90.00 days
Start: 2023-06-16 — End: ?

## 2023-06-19 MED ORDER — APIXABAN 5 MG TABLET
ORAL_TABLET | Freq: Two times a day (BID) | ORAL | 0 refills | 90.00 days | Status: CP
Start: 2023-06-19 — End: ?
  Filled 2023-07-28: qty 180, 90d supply, fill #0

## 2023-06-21 MED FILL — HYDROCORTISONE 2.5 % TOPICAL CREAM WITH PERINEAL APPLICATOR: 20 days supply | Qty: 30 | Fill #0

## 2023-06-21 MED FILL — ACETAMINOPHEN 500 MG TABLET: ORAL | 15 days supply | Qty: 90 | Fill #2

## 2023-06-21 MED FILL — BACLOFEN 20 MG TABLET: ORAL | 30 days supply | Qty: 60 | Fill #1

## 2023-06-28 DIAGNOSIS — Z7952 Long term (current) use of systemic steroids: Principal | ICD-10-CM

## 2023-06-28 DIAGNOSIS — G959 Disease of spinal cord, unspecified: Principal | ICD-10-CM

## 2023-06-28 MED ORDER — HYDROCODONE 5 MG-ACETAMINOPHEN 325 MG TABLET
ORAL_TABLET | Freq: Two times a day (BID) | ORAL | 0 refills | 30.00 days | Status: CN
Start: 2023-06-28 — End: ?

## 2023-06-28 MED ORDER — CALCIUM 600 MG (AS CALCIUM CARBONATE 1,500 MG) TABLET
ORAL_TABLET | Freq: Every day | ORAL | 5 refills | 30.00 days
Start: 2023-06-28 — End: ?

## 2023-06-30 MED FILL — ACETAMINOPHEN 500 MG TABLET: ORAL | 15 days supply | Qty: 90 | Fill #3

## 2023-06-30 MED FILL — MELATONIN 3 MG TABLET: ORAL | 100 days supply | Qty: 100 | Fill #1

## 2023-07-03 MED ORDER — HYDROCODONE 5 MG-ACETAMINOPHEN 325 MG TABLET
ORAL_TABLET | Freq: Two times a day (BID) | ORAL | 0 refills | 30.00 days
Start: 2023-07-03 — End: ?

## 2023-07-14 ENCOUNTER — Encounter: Admit: 2023-07-14 | Discharge: 2023-07-15 | Payer: PRIVATE HEALTH INSURANCE | Attending: Neurology | Primary: Neurology

## 2023-07-14 DIAGNOSIS — G35 Multiple sclerosis: Principal | ICD-10-CM

## 2023-07-14 MED ORDER — GABAPENTIN 600 MG TABLET
ORAL_TABLET | Freq: Three times a day (TID) | ORAL | 1 refills | 90.00 days | Status: CP
Start: 2023-07-14 — End: ?

## 2023-07-14 MED ORDER — DIAZEPAM 5 MG TABLET
ORAL_TABLET | Freq: Three times a day (TID) | ORAL | 2 refills | 30.00 days | Status: CP
Start: 2023-07-14 — End: ?

## 2023-07-14 MED ORDER — LAMOTRIGINE 100 MG TABLET
ORAL_TABLET | Freq: Two times a day (BID) | ORAL | 1 refills | 90.00 days | Status: CP
Start: 2023-07-14 — End: ?

## 2023-07-16 DIAGNOSIS — G35 Multiple sclerosis: Principal | ICD-10-CM

## 2023-07-16 MED ORDER — LAMOTRIGINE 100 MG TABLET
ORAL_TABLET | Freq: Two times a day (BID) | ORAL | 1 refills | 90.00 days
Start: 2023-07-16 — End: ?

## 2023-07-16 MED ORDER — DIAZEPAM 5 MG TABLET
ORAL_TABLET | Freq: Three times a day (TID) | ORAL | 2 refills | 30.00 days
Start: 2023-07-16 — End: ?

## 2023-07-16 MED ORDER — GABAPENTIN 600 MG TABLET
ORAL_TABLET | Freq: Three times a day (TID) | ORAL | 1 refills | 90.00 days
Start: 2023-07-16 — End: ?

## 2023-07-26 DIAGNOSIS — I82409 Acute embolism and thrombosis of unspecified deep veins of unspecified lower extremity: Principal | ICD-10-CM

## 2023-07-28 MED FILL — CHOLECALCIFEROL (VITAMIN D3) 50 MCG (2,000 UNIT) CAPSULE: ORAL | 50 days supply | Qty: 100 | Fill #2

## 2023-07-29 MED ORDER — BACLOFEN 20 MG TABLET
ORAL_TABLET | Freq: Two times a day (BID) | ORAL | 1 refills | 30.00 days | Status: CP
Start: 2023-07-29 — End: 2023-09-27

## 2023-08-06 DIAGNOSIS — G35 Multiple sclerosis: Principal | ICD-10-CM

## 2023-08-06 DIAGNOSIS — Z7952 Long term (current) use of systemic steroids: Principal | ICD-10-CM

## 2023-08-06 DIAGNOSIS — G959 Disease of spinal cord, unspecified: Principal | ICD-10-CM

## 2023-08-06 DIAGNOSIS — M62461 Contracture of muscle, right lower leg: Principal | ICD-10-CM

## 2023-08-06 DIAGNOSIS — M62462 Contracture of muscle, left lower leg: Principal | ICD-10-CM

## 2023-08-06 DIAGNOSIS — G8220 Paraplegia, unspecified: Principal | ICD-10-CM

## 2023-08-06 MED ORDER — CALCIUM 600 MG (AS CALCIUM CARBONATE 1,500 MG) TABLET
ORAL_TABLET | Freq: Every day | ORAL | 5 refills | 30.00 days | Status: CP
Start: 2023-08-06 — End: ?
  Filled 2023-08-09: qty 60, 60d supply, fill #0

## 2023-08-06 MED ORDER — BACLOFEN 20 MG TABLET
ORAL_TABLET | Freq: Two times a day (BID) | ORAL | 0 refills | 30.00 days | Status: CP
Start: 2023-08-06 — End: 2023-09-05

## 2023-08-09 MED FILL — HYDROCORTISONE 2.5 % TOPICAL CREAM WITH PERINEAL APPLICATOR: 20 days supply | Qty: 30 | Fill #1

## 2023-08-09 MED FILL — MELATONIN 3 MG TABLET: ORAL | 100 days supply | Qty: 100 | Fill #2

## 2023-08-09 MED FILL — CHOLECALCIFEROL (VITAMIN D3) 50 MCG (2,000 UNIT) CAPSULE: ORAL | 50 days supply | Qty: 100 | Fill #3

## 2023-08-09 MED FILL — ACETAMINOPHEN 500 MG TABLET: ORAL | 15 days supply | Qty: 90 | Fill #4

## 2023-08-11 ENCOUNTER — Ambulatory Visit
Admit: 2023-08-11 | Discharge: 2023-08-12 | Payer: PRIVATE HEALTH INSURANCE | Attending: Spinal Cord Injury Medicine | Primary: Spinal Cord Injury Medicine

## 2023-08-11 ENCOUNTER — Ambulatory Visit: Admit: 2023-08-11 | Discharge: 2023-08-12 | Payer: PRIVATE HEALTH INSURANCE | Attending: Neurology | Primary: Neurology

## 2023-08-11 MED ORDER — DIAZEPAM 5 MG TABLET
ORAL_TABLET | Freq: Three times a day (TID) | ORAL | 2 refills | 30.00 days | Status: CP
Start: 2023-08-11 — End: ?

## 2023-08-11 MED ORDER — NALTREXONE 1.5 MG CAPSULE
ORAL_CAPSULE | ORAL | 0 refills | 88.00 days | Status: CP
Start: 2023-08-11 — End: 2023-11-07

## 2023-08-11 MED ORDER — BACLOFEN 20 MG TABLET
ORAL_TABLET | Freq: Three times a day (TID) | ORAL | 3 refills | 30.00 days | Status: CP
Start: 2023-08-11 — End: 2023-10-10
  Filled 2023-08-31: qty 90, 30d supply, fill #0

## 2023-08-11 MED ORDER — GABAPENTIN 600 MG TABLET
ORAL_TABLET | Freq: Four times a day (QID) | ORAL | 1 refills | 90.00 days | Status: CP
Start: 2023-08-11 — End: 2023-08-11
  Filled 2023-08-16: qty 120, 30d supply, fill #0

## 2023-08-21 MED ORDER — APIXABAN 5 MG TABLET
ORAL_TABLET | Freq: Two times a day (BID) | ORAL | 1 refills | 90.00 days | Status: CP
Start: 2023-08-21 — End: ?

## 2023-09-17 DIAGNOSIS — G35 Multiple sclerosis: Principal | ICD-10-CM

## 2023-09-17 DIAGNOSIS — Z7401 Bed confinement status: Principal | ICD-10-CM

## 2023-09-23 DIAGNOSIS — M62838 Other muscle spasm: Principal | ICD-10-CM

## 2023-09-23 MED ORDER — DIAZEPAM 5 MG TABLET
ORAL_TABLET | Freq: Three times a day (TID) | ORAL | 2 refills | 30.00 days
Start: 2023-09-23 — End: ?

## 2023-09-24 MED ORDER — DIAZEPAM 5 MG TABLET
ORAL_TABLET | Freq: Three times a day (TID) | ORAL | 2 refills | 30.00 days | Status: CP
Start: 2023-09-24 — End: ?

## 2023-11-14 DEATH — deceased
